# Patient Record
Sex: Female | Born: 1980 | Race: White | Hispanic: No | Marital: Married | Smoking: Never smoker
Health system: Southern US, Community
[De-identification: ages and names within clinical notes are randomized; demographics above are authoritative.]

## PROBLEM LIST (undated history)

## (undated) ENCOUNTER — Inpatient Hospital Stay (HOSPITAL_COMMUNITY): Payer: Self-pay

## (undated) DIAGNOSIS — N390 Urinary tract infection, site not specified: Secondary | ICD-10-CM

## (undated) DIAGNOSIS — K8020 Calculus of gallbladder without cholecystitis without obstruction: Secondary | ICD-10-CM

## (undated) DIAGNOSIS — R87629 Unspecified abnormal cytological findings in specimens from vagina: Secondary | ICD-10-CM

## (undated) DIAGNOSIS — N200 Calculus of kidney: Secondary | ICD-10-CM

## (undated) HISTORY — PX: TONSILLECTOMY: SUR1361

## (undated) HISTORY — DX: Calculus of kidney: N20.0

## (undated) HISTORY — DX: Calculus of gallbladder without cholecystitis without obstruction: K80.20

## (undated) HISTORY — DX: Unspecified abnormal cytological findings in specimens from vagina: R87.629

## (undated) HISTORY — DX: Urinary tract infection, site not specified: N39.0

## (undated) HISTORY — PX: CHOLECYSTECTOMY: SHX55

---
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
## 2001-07-07 HISTORY — PX: CERVICAL CONE BIOPSY: SUR198

## 2002-01-25 ENCOUNTER — Other Ambulatory Visit: Admission: RE | Admit: 2002-01-25 | Discharge: 2002-01-25 | Payer: Self-pay | Admitting: Obstetrics and Gynecology

## 2002-07-12 ENCOUNTER — Other Ambulatory Visit: Admission: RE | Admit: 2002-07-12 | Discharge: 2002-07-12 | Payer: Self-pay | Admitting: Obstetrics and Gynecology

## 2002-09-13 ENCOUNTER — Ambulatory Visit (HOSPITAL_COMMUNITY): Admission: RE | Admit: 2002-09-13 | Discharge: 2002-09-13 | Payer: Self-pay | Admitting: Obstetrics and Gynecology

## 2002-11-29 ENCOUNTER — Other Ambulatory Visit: Admission: RE | Admit: 2002-11-29 | Discharge: 2002-11-29 | Payer: Self-pay | Admitting: Obstetrics and Gynecology

## 2003-04-28 ENCOUNTER — Ambulatory Visit (HOSPITAL_COMMUNITY): Admission: RE | Admit: 2003-04-28 | Discharge: 2003-04-28 | Payer: Self-pay | Admitting: Obstetrics and Gynecology

## 2004-10-22 ENCOUNTER — Other Ambulatory Visit: Admission: RE | Admit: 2004-10-22 | Discharge: 2004-10-22 | Payer: Self-pay | Admitting: Obstetrics and Gynecology

## 2005-04-30 ENCOUNTER — Encounter: Admission: RE | Admit: 2005-04-30 | Discharge: 2005-04-30 | Payer: Self-pay | Admitting: Sports Medicine

## 2005-05-16 ENCOUNTER — Other Ambulatory Visit: Admission: RE | Admit: 2005-05-16 | Discharge: 2005-05-16 | Payer: Self-pay | Admitting: Obstetrics and Gynecology

## 2011-12-31 ENCOUNTER — Other Ambulatory Visit (HOSPITAL_COMMUNITY): Payer: Self-pay | Admitting: Obstetrics and Gynecology

## 2011-12-31 DIAGNOSIS — N979 Female infertility, unspecified: Secondary | ICD-10-CM

## 2012-01-05 ENCOUNTER — Ambulatory Visit (HOSPITAL_COMMUNITY)
Admission: RE | Admit: 2012-01-05 | Discharge: 2012-01-05 | Disposition: A | Discharge status: Home / Self Care | Payer: BC Managed Care – PPO | Source: Ambulatory Visit | Attending: Obstetrics and Gynecology | Admitting: Obstetrics and Gynecology

## 2012-01-05 DIAGNOSIS — N979 Female infertility, unspecified: Secondary | ICD-10-CM | POA: Insufficient documentation

## 2012-01-05 MED ORDER — IOHEXOL 300 MG/ML  SOLN: 6.0000 mL | Freq: Once | INTRAMUSCULAR | Status: AC | PRN

## 2012-04-06 ENCOUNTER — Other Ambulatory Visit: Payer: Self-pay

## 2012-04-27 LAB — OB RESULTS CONSOLE ABO/RH: RH Type: POSITIVE

## 2012-04-27 LAB — OB RESULTS CONSOLE GC/CHLAMYDIA: Chlamydia: NEGATIVE

## 2012-04-27 LAB — OB RESULTS CONSOLE HEPATITIS B SURFACE ANTIGEN: Hepatitis B Surface Ag: NEGATIVE

## 2012-04-27 LAB — OB RESULTS CONSOLE RPR: RPR: NONREACTIVE

## 2012-04-27 LAB — OB RESULTS CONSOLE ANTIBODY SCREEN: Antibody Screen: NEGATIVE

## 2012-06-15 ENCOUNTER — Inpatient Hospital Stay (HOSPITAL_COMMUNITY)
Admission: AD | Admit: 2012-06-15 | Discharge: 2012-06-15 | Disposition: A | Discharge status: Home / Self Care | Payer: BC Managed Care – PPO | Source: Ambulatory Visit | Attending: Obstetrics and Gynecology | Admitting: Obstetrics and Gynecology

## 2012-06-15 ENCOUNTER — Inpatient Hospital Stay (HOSPITAL_COMMUNITY): Payer: BC Managed Care – PPO

## 2012-06-15 ENCOUNTER — Encounter (HOSPITAL_COMMUNITY): Payer: Self-pay

## 2012-06-15 DIAGNOSIS — O219 Vomiting of pregnancy, unspecified: Secondary | ICD-10-CM

## 2012-06-15 DIAGNOSIS — O26899 Other specified pregnancy related conditions, unspecified trimester: Secondary | ICD-10-CM

## 2012-06-15 DIAGNOSIS — K5900 Constipation, unspecified: Secondary | ICD-10-CM

## 2012-06-15 DIAGNOSIS — M549 Dorsalgia, unspecified: Secondary | ICD-10-CM | POA: Insufficient documentation

## 2012-06-15 DIAGNOSIS — R109 Unspecified abdominal pain: Secondary | ICD-10-CM | POA: Insufficient documentation

## 2012-06-15 DIAGNOSIS — O210 Mild hyperemesis gravidarum: Secondary | ICD-10-CM | POA: Insufficient documentation

## 2012-06-15 LAB — URINALYSIS, ROUTINE W REFLEX MICROSCOPIC
Bilirubin Urine: NEGATIVE
Glucose, UA: NEGATIVE mg/dL
Ketones, ur: NEGATIVE mg/dL
Leukocytes, UA: NEGATIVE
Nitrite: NEGATIVE
Protein, ur: NEGATIVE mg/dL

## 2012-06-15 LAB — URINE MICROSCOPIC-ADD ON

## 2012-06-15 MED ORDER — OXYCODONE-ACETAMINOPHEN 5-325 MG PO TABS: 1.0000 | ORAL_TABLET | Freq: Four times a day (QID) | ORAL | Status: DC | PRN

## 2012-06-15 MED ORDER — DOCUSATE SODIUM 100 MG PO CAPS: 100.0000 mg | ORAL_CAPSULE | Freq: Two times a day (BID) | ORAL | Status: DC

## 2012-06-15 MED ORDER — OXYCODONE-ACETAMINOPHEN 5-325 MG PO TABS
1.0000 | ORAL_TABLET | Freq: Once | ORAL | Status: AC
  Administered 2012-06-15: 1 via ORAL
  Filled 2012-06-15: qty 1

## 2012-06-15 MED ORDER — LACTATED RINGERS IV BOLUS (SEPSIS)
1000.0000 mL | Freq: Once | INTRAVENOUS | Status: AC
  Administered 2012-06-15: 1000 mL via INTRAVENOUS

## 2012-06-15 MED ORDER — ONDANSETRON HCL 4 MG/2ML IJ SOLN
4.0000 mg | Freq: Once | INTRAMUSCULAR | Status: AC
  Administered 2012-06-15: 4 mg via INTRAVENOUS
  Filled 2012-06-15: qty 2

## 2012-06-15 NOTE — Progress Notes (Signed)
Dr. Claiborne Billings notified of patient return from [place] and reports pain 1/10. [place] negative for kidney stone. Dr. Claiborne Billings asked for E. Key NP to write for 10 percocet at discharge and to strain all urine.

## 2012-06-15 NOTE — MAU Note (Signed)
Pt is discharged home by Dr. Claiborne Billings and is upset that she is being sent home. Pt says she is worried that she will go home and the pain will become worse and the vomiting will start again. Pt is concerned that we do not have a known reason for the pain and vomiting. Pt is unconvinced that the vomiting is from a virus due to the duration of the problem.

## 2012-06-15 NOTE — MAU Note (Signed)
Pt presents to MAU with chief complaint of N/V and lower left abdominal pain. Pt is a G3P1 at [redacted]w[redacted]d with prior history of a 35 week delivery due to Oligohydramnios. Pt says the N/V started last Friday and has been off and on. Pt denies any complications with this pregnancy. Denies bleeding, or abnormal discharge.

## 2012-06-15 NOTE — H&P (Signed)
31 y.o. complains of nausea and vomiting, not being able to keep anything down since last night.  Pt also reports abdominal pain that she finds difficult to describe.  She states at times it has been all over.  Worse with vomiting.  It started Saturday and she called the on call dr. And was prescribed phenergan suppositories that put her to sleep so didn't experience pain and nausea resolved.  She said she ate well last night and pain resolved but then woke up at 4am with N/V and pain.  She says the pain is mainly on the left side and indicates her inguinal area but sometimes starts higher and radiates the that region.  She also reports some b/l low back pain.  She denies fever, any urinary symptoms. She has some constipation. She denies vaginal bleeding or pelvic cramping.  No other complaints.  History reviewed. No pertinent past medical history. Past Surgical History  Procedure Date  . Cholecystectomy     History   Social History  . Marital Status: Married    Spouse Name: N/A    Number of Children: N/A  . Years of Education: N/A   Occupational History  . Not on file.   Social History Main Topics  . Smoking status: Never Smoker   . Smokeless tobacco: Not on file  . Alcohol Use: No  . Drug Use: No  . Sexually Active: Yes   Other Topics Concern  . Not on file   Social History Narrative  . No narrative on file    No current facility-administered medications on file prior to encounter.   Current Outpatient Prescriptions on File Prior to Encounter  Medication Sig Dispense Refill  . promethazine (PHENERGAN) 25 MG suppository Place 25 mg rectally every 6 (six) hours as needed. nausea        No Known Allergies  @VITALS2 @  Lungs: clear to ascultation Cor:  RRR Abdomen:  soft, nontender, nondistended, no rebound or guarding Back: no CVA tenderness Ex:  no cords, erythema Pelvic:  Deferred  Labs: UA neg except for moderate blood [place]: no obvious nephrolithiasis  A/P:   Nephrolithiasis vs constipation/gas pain Despite no stone visualized on [place], I suspect small stone present or recently passed.  Pt received one percocet while in MAU and pain diminished to a 1 out of 10.  Discussed possibility of stone with patient and treatment for such.  Advised patient to increase fluid intake and strain urine.  Percocet prn. Call tomorrow to f/u in office.  Will not preform CT at this time as pain has resolved.  Pt understands to call if fever develops or worsening pain or other problems.   Philip Aspen

## 2012-06-15 NOTE — MAU Note (Signed)
Dr. Claiborne Billings returned RN's call and was asked to come and speak to the patient. Dr. Claiborne Billings on her way to MAU to see the patient.

## 2012-06-15 NOTE — MAU Provider Note (Signed)
History     CSN: [id number]  Arrival date and time: 06/15/12 7425   First Provider Initiated Contact with Patient 06/15/12 1032      Chief Complaint  Patient presents with  . Emesis During Pregnancy  . Abdominal Pain  . Back Pain   HPI Victoria Romero is 31 y.o. [phone] [redacted]w[redacted]d weeks presenting with persistent nausea and vomiting in pregnancy.  Began suddenly 4 days ago.  Thought it was a virus.  She has had same sxs during this pregnancy.  Has used Zofran as well.   Spoke to Dr. On call over the weekend and Phenergan Supp called in.  She used them Sat and Sun night.  Was able to go to work but nausea/vomiting returned early this am.  Vomited X 3 today.  DId not use phenergan today because she had to drive.  Called Nurse on call instructed to come to MAU.  Denies diarrhea, fever.   Hasn't eaten or had fluids since 7pm last night.  Was able to keep fluids in yesterday.  Has lower abdominal pain associated with vomiting, now constant.  Hx constipation--last normal BM 4 days ago and had a little stool today.    History reviewed. No pertinent past medical history.  Past Surgical History  Procedure Date  . Cholecystectomy     History reviewed. No pertinent family history.  History  Substance Use Topics  . Smoking status: Never Smoker   . Smokeless tobacco: Not on file  . Alcohol Use: No    Allergies: No Known Allergies  Prescriptions prior to admission  Medication Sig Dispense Refill  . ondansetron (ZOFRAN) 4 MG tablet Take 4 mg by mouth every 8 (eight) hours as needed. nausea      . Prenatal Vit-Fe Fumarate-FA (PRENATAL MULTIVITAMIN) TABS Take 1 tablet by mouth daily.      . promethazine (PHENERGAN) 25 MG suppository Place 25 mg rectally every 6 (six) hours as needed. nausea        Review of Systems  Constitutional: Negative for fever and chills.  Gastrointestinal: Positive for nausea, vomiting and abdominal pain (lower). Negative for diarrhea.  Musculoskeletal: Positive for  back pain (lower).   Physical Exam   Blood pressure 124/79, pulse 97, temperature 98 F (36.7 C), temperature source Oral, resp. rate 16, height 5\' 7"  (1.702 m), weight 160 lb 3.2 oz (72.666 kg), SpO2 100.00%.  Physical Exam  Constitutional: She is oriented to person, place, and time. She appears well-developed and well-nourished. No distress.  HENT:  Head: Normocephalic.  Neck: Normal range of motion.  Cardiovascular: Normal rate.   Respiratory: Effort normal.  GI: There is no tenderness.  Genitourinary:       Dilation: Closed Effacement (%): Thick Cervical Position: Middle Presentation: Undeterminable Exam by:: J. Rasch RN   Neurological: She is alert and oriented to person, place, and time.  Skin: Skin is warm and dry.  Psychiatric: She has a normal mood and affect. Her behavior is normal.      Results for orders placed during the hospital encounter of 06/15/12 (from the past 24 hour(s))  URINALYSIS, ROUTINE W REFLEX MICROSCOPIC     Status: Abnormal   Collection Time   06/15/12  9:20 AM      Component Value Range   Color, Urine YELLOW  YELLOW   APPearance CLEAR  CLEAR   Specific Gravity, Urine 1.020  1.005 - 1.030   pH 6.0  5.0 - 8.0   Glucose, UA NEGATIVE  NEGATIVE mg/dL  Hgb urine dipstick MODERATE (*) NEGATIVE   Bilirubin Urine NEGATIVE  NEGATIVE   Ketones, ur NEGATIVE  NEGATIVE mg/dL   Protein, ur NEGATIVE  NEGATIVE mg/dL   Urobilinogen, UA 1.0  0.0 - 1.0 mg/dL   Nitrite NEGATIVE  NEGATIVE   Leukocytes, UA NEGATIVE  NEGATIVE  URINE MICROSCOPIC-ADD ON     Status: Normal   Collection Time   06/15/12  9:20 AM      Component Value Range   Squamous Epithelial / LPF RARE  RARE   WBC, UA 0-2  <3 WBC/hpf   RBC / HPF 0-2  <3 RBC/hpf   Urine-Other YEAST      MAU Course  Procedures  Urine culture to lab  MDM 10:40  Call to Dr. Claiborne Billings to report MSE, she asked if she could call back.   12:20  Patient has stopped vomiting after 1 liter of LR with 4mg  of Zofran.   States her lower back and lower abdominal pain is worse.  Points to suprapubic area and lower back.  I called Dr. Claiborne Billings and reported response to IV hydration and Zofran as well as pain in her lower abdomen and lower back.  Order given to check cervix and if closed discharge to home with Colace.  F/U with Dr. Arlyce Dice  15:55 Victoria Bowens, Rn reports that Dr. Claiborne Billings was informed of the negative renal ultrasound results.  She states Dr. Claiborne Billings is not longer in the hospital and that Dr. Claiborne Billings asked that I write the patient a Rx for Percocet #10 tabs for discharge.   Assessment and Plan  A:  Nausea  and Vomiting at [redacted]w[redacted]d gestation     Abdominal and lower back pain     Hx constipation  P:  Rx for Colace      Stressed importance of staying well hydrated     Call Dr. Arlyce Dice  if sxs worsen.  KEY,EVE M 06/15/2012, 12:19 PM

## 2012-06-15 NOTE — MAU Note (Signed)
Patient states she has been vomiting since 12-7, has taken Phenergan and felt better until about 0300. Hasn't keep anything down. Has been having abdominal cramping since 12-7.

## 2012-06-16 LAB — URINE CULTURE

## 2012-07-07 NOTE — L&D Delivery Note (Signed)
Patient was C/C and pushed for approx. 40 minutes with epidural.   NSVD female infant, Apgars 8/9, weight pending.   The patient had no lacerations needing repair. Fundus was firm. EBL was expected. Placenta was delivered intact. Vagina was clear.  Baby was vigorous to bedside.  Philip Aspen

## 2012-10-28 ENCOUNTER — Encounter (HOSPITAL_COMMUNITY): Payer: Self-pay | Admitting: Anesthesiology

## 2012-10-28 ENCOUNTER — Inpatient Hospital Stay (HOSPITAL_COMMUNITY)
Admission: AD | Admit: 2012-10-28 | Discharge: 2012-10-30 | DRG: 372 | Disposition: A | Discharge status: Home / Self Care | Payer: BC Managed Care – PPO | Source: Ambulatory Visit | Attending: Obstetrics and Gynecology | Admitting: Obstetrics and Gynecology

## 2012-10-28 ENCOUNTER — Inpatient Hospital Stay (HOSPITAL_COMMUNITY): Payer: BC Managed Care – PPO | Admitting: Anesthesiology

## 2012-10-28 ENCOUNTER — Encounter (HOSPITAL_COMMUNITY): Payer: Self-pay | Admitting: General Practice

## 2012-10-28 DIAGNOSIS — O99892 Other specified diseases and conditions complicating childbirth: Secondary | ICD-10-CM | POA: Diagnosis present

## 2012-10-28 DIAGNOSIS — Z22330 Carrier of Group B streptococcus: Secondary | ICD-10-CM

## 2012-10-28 DIAGNOSIS — O4290 Premature rupture of membranes, unspecified as to length of time between rupture and onset of labor, unspecified weeks of gestation: Principal | ICD-10-CM | POA: Diagnosis present

## 2012-10-28 DIAGNOSIS — Z3480 Encounter for supervision of other normal pregnancy, unspecified trimester: Secondary | ICD-10-CM

## 2012-10-28 LAB — CBC
HCT: 33.9 % — ABNORMAL LOW (ref 36.0–46.0)
MCH: 28 pg (ref 26.0–34.0)
MCHC: 32.7 g/dL (ref 30.0–36.0)
MCV: 85.6 fL (ref 78.0–100.0)
Platelets: 195 10*3/uL (ref 150–400)
RDW: 14.6 % (ref 11.5–15.5)
WBC: 9.5 10*3/uL (ref 4.0–10.5)

## 2012-10-28 MED ORDER — DIPHENHYDRAMINE HCL 50 MG/ML IJ SOLN: 12.5000 mg | INTRAMUSCULAR | Status: DC | PRN

## 2012-10-28 MED ORDER — OXYTOCIN 40 UNITS IN LACTATED RINGERS INFUSION - SIMPLE MED
1.0000 m[IU]/min | INTRAVENOUS | Status: DC
  Administered 2012-10-28: 18 m[IU]/min via INTRAVENOUS
  Administered 2012-10-28: 2 m[IU]/min via INTRAVENOUS
  Filled 2012-10-28: qty 1000

## 2012-10-28 MED ORDER — EPHEDRINE 5 MG/ML INJ
10.0000 mg | INTRAVENOUS | Status: DC | PRN
  Filled 2012-10-28: qty 4
  Filled 2012-10-28: qty 2

## 2012-10-28 MED ORDER — LACTATED RINGERS IV SOLN
INTRAVENOUS | Status: DC
  Administered 2012-10-28: 23:00:00 via INTRAVENOUS
  Administered 2012-10-28: 125 mL/h via INTRAVENOUS

## 2012-10-28 MED ORDER — LIDOCAINE HCL (PF) 1 % IJ SOLN
INTRAMUSCULAR | Status: DC | PRN
  Administered 2012-10-28 (×5): 4 mL

## 2012-10-28 MED ORDER — FENTANYL 2.5 MCG/ML BUPIVACAINE 1/10 % EPIDURAL INFUSION (WH - ANES)
14.0000 mL/h | INTRAMUSCULAR | Status: DC | PRN
  Administered 2012-10-28 – 2012-10-29 (×2): 14 mL/h via EPIDURAL
  Filled 2012-10-28 (×2): qty 125

## 2012-10-28 MED ORDER — LACTATED RINGERS IV SOLN
500.0000 mL | Freq: Once | INTRAVENOUS | Status: AC
  Administered 2012-10-28: 500 mL via INTRAVENOUS

## 2012-10-28 MED ORDER — OXYTOCIN BOLUS FROM INFUSION
500.0000 mL | INTRAVENOUS | Status: DC
  Administered 2012-10-29: 500 mL via INTRAVENOUS

## 2012-10-28 MED ORDER — PENICILLIN G POTASSIUM 5000000 UNITS IJ SOLR
5.0000 10*6.[IU] | Freq: Once | INTRAVENOUS | Status: AC
  Administered 2012-10-28: 5 10*6.[IU] via INTRAVENOUS
  Filled 2012-10-28: qty 5

## 2012-10-28 MED ORDER — LIDOCAINE HCL (PF) 1 % IJ SOLN
30.0000 mL | INTRAMUSCULAR | Status: DC | PRN
  Filled 2012-10-28: qty 30

## 2012-10-28 MED ORDER — PENICILLIN G POTASSIUM 5000000 UNITS IJ SOLR
2.5000 10*6.[IU] | INTRAVENOUS | Status: DC
  Administered 2012-10-28 – 2012-10-29 (×3): 2.5 10*6.[IU] via INTRAVENOUS
  Filled 2012-10-28 (×7): qty 2.5

## 2012-10-28 MED ORDER — TERBUTALINE SULFATE 1 MG/ML IJ SOLN: 0.2500 mg | Freq: Once | INTRAMUSCULAR | Status: AC | PRN

## 2012-10-28 MED ORDER — EPHEDRINE 5 MG/ML INJ
10.0000 mg | INTRAVENOUS | Status: DC | PRN
  Filled 2012-10-28: qty 2

## 2012-10-28 MED ORDER — ACETAMINOPHEN 325 MG PO TABS: 650.0000 mg | ORAL_TABLET | ORAL | Status: DC | PRN

## 2012-10-28 MED ORDER — CITRIC ACID-SODIUM CITRATE 334-500 MG/5ML PO SOLN: 30.0000 mL | ORAL | Status: DC | PRN

## 2012-10-28 MED ORDER — LACTATED RINGERS IV SOLN: 500.0000 mL | INTRAVENOUS | Status: DC | PRN

## 2012-10-28 MED ORDER — OXYTOCIN 40 UNITS IN LACTATED RINGERS INFUSION - SIMPLE MED
62.5000 mL/h | INTRAVENOUS | Status: DC
  Administered 2012-10-29: 62.5 mL/h via INTRAVENOUS

## 2012-10-28 MED ORDER — OXYCODONE-ACETAMINOPHEN 5-325 MG PO TABS: 1.0000 | ORAL_TABLET | ORAL | Status: DC | PRN

## 2012-10-28 MED ORDER — IBUPROFEN 600 MG PO TABS: 600.0000 mg | ORAL_TABLET | Freq: Four times a day (QID) | ORAL | Status: DC | PRN

## 2012-10-28 MED ORDER — ONDANSETRON HCL 4 MG/2ML IJ SOLN: 4.0000 mg | Freq: Four times a day (QID) | INTRAMUSCULAR | Status: DC | PRN

## 2012-10-28 MED ORDER — PHENYLEPHRINE 40 MCG/ML (10ML) SYRINGE FOR IV PUSH (FOR BLOOD PRESSURE SUPPORT)
80.0000 ug | PREFILLED_SYRINGE | INTRAVENOUS | Status: DC | PRN
  Filled 2012-10-28: qty 2

## 2012-10-28 MED ORDER — PHENYLEPHRINE 40 MCG/ML (10ML) SYRINGE FOR IV PUSH (FOR BLOOD PRESSURE SUPPORT)
80.0000 ug | PREFILLED_SYRINGE | INTRAVENOUS | Status: DC | PRN
  Filled 2012-10-28: qty 5
  Filled 2012-10-28: qty 2

## 2012-10-28 MED ORDER — FLEET ENEMA 7-19 GM/118ML RE ENEM: 1.0000 | ENEMA | Freq: Once | RECTAL | Status: DC

## 2012-10-28 NOTE — H&P (Signed)
32 y.o. [redacted]w[redacted]d  [id number] comes in from office after being found PROM.  Pt states she started leaking about 7am.  She denies any contractions.  Otherwise has good fetal movement and no bleeding.  History reviewed. No pertinent past medical history.  Past Surgical History  Procedure Laterality Date  . Cholecystectomy    . Leep  2002  . Cervical cone biopsy  2003    OB History   Grav Para Term Preterm Abortions TAB SAB Ect Mult Living   3 1 0 1 1 0 1 0 0 1      # Outc Date GA Lbr Len/2nd Wgt Sex Del Anes PTL Lv   1 PRE            2 SAB            3 CUR               History   Social History  . Marital Status: Married    Spouse Name: N/A    Number of Children: N/A  . Years of Education: N/A   Occupational History  . Not on file.   Social History Main Topics  . Smoking status: Never Smoker   . Smokeless tobacco: Not on file  . Alcohol Use: No  . Drug Use: No  . Sexually Active: Yes   Other Topics Concern  . Not on file   Social History Narrative  . No narrative on file   Review of patient's allergies indicates no known allergies.    Prenatal Transfer Tool  Maternal Diabetes: No Genetic Screening: Normal Maternal Ultrasounds/Referrals: Normal Fetal Ultrasounds or other Referrals:  None Maternal Substance Abuse:  No Significant Maternal Medications:  None Significant Maternal Lab Results: Lab values include: Group B Strep positive  Other PNC:  H/o Leep and CKC, may have some fibrotic scarring at os    Filed Vitals:   10/28/12 1606  BP: 95/57  Pulse: 85  Temp:   Resp: 20     Lungs/Cor:  NAD Abdomen:  soft, gravid Ex:  no cords, erythema SVE:  0/70/-2 FHTs:  140, good STV, NST R Toco:  None at admission, currently not tracing well, but prior to rolling over was q3, pit @10    A/P   Admit to L&D with PROM  GBS Pos - PCN  Pit 2x2  Epidural when desired  Other routine care.  Philip Aspen

## 2012-10-28 NOTE — Lactation Note (Signed)
Lactation Consultation Note  Patient Name: Victoria Romero Date: 10/28/2012   [facility] contacted this patient by phone at her request prior to delivery  Maternal Data  Mom states she wants to pump and bottle-feed and hopes to purchase an electric pump prior to discharge.  At this time, she is in early labor.  Feeding    LATCH Score/Interventions           N/A - baby not yet delivered           Lactation Tools Discussed/Used   Pumping options and recommendations; informed patient that after delivery, LC or RN will assist her to start pumping within baby's first 6 hours and encourage her, if possible to pump at least every 3 hours with hospital pump.  LC will follow-up after delivery and discuss purchase and/or rental choices.  Mom to call her insurance company to determine if they would cover purchase or instead, would send a pump to her, as this would determine what she would need after discharge.  Consult Status   LC to follow-up after delivery   Lynda Rainwater 10/28/2012, 7:23 PM

## 2012-10-28 NOTE — Progress Notes (Signed)
Pt comfortable, s/p epidural FHT: overall reassuring 150 mod var + accels, some recent variables and several late in appearance TOCO 2-4 SVE: 3/90/-1 Pit @18  Continue current mgmt with position changes, O2, fluid prn

## 2012-10-28 NOTE — Progress Notes (Signed)
Pt refusing Pitocin at this time. Pt waiting for husband to arrive at hospital.  Will probably consent to Pitocin at about 1300

## 2012-10-28 NOTE — Anesthesia Preprocedure Evaluation (Signed)

## 2012-10-28 NOTE — Anesthesia Procedure Notes (Signed)
Epidural Patient location during procedure: OB Start time: 10/28/2012 6:35 PM  Staffing Performed by: anesthesiologist   Preanesthetic Checklist Completed: patient identified, site marked, surgical consent, pre-op evaluation, timeout performed, IV checked, risks and benefits discussed and monitors and equipment checked  Epidural Patient position: sitting Prep: site prepped and draped and DuraPrep Patient monitoring: continuous pulse ox and blood pressure Approach: midline Injection technique: LOR air  Needle:  Needle type: Tuohy  Needle gauge: 17 G Needle length: 9 cm and 9 Needle insertion depth: 5 cm cm Catheter type: closed end flexible Catheter size: 19 Gauge Catheter at skin depth: 10 cm Test dose: negative  Assessment Events: blood not aspirated, injection not painful, no injection resistance, negative IV test and no paresthesia  Additional Notes Discussed risk of headache, infection, bleeding, nerve injury and failed or incomplete block.  Patient voices understanding and wishes to proceed.  Epidural placed easily on first attempt.  No paresthesia.  Patient tolerated procedure well with no apparent complications.  Jasmine December, MDReason for block:procedure for pain

## 2012-10-29 ENCOUNTER — Encounter (HOSPITAL_COMMUNITY): Payer: Self-pay | Admitting: *Deleted

## 2012-10-29 LAB — CBC
MCV: 85.9 fL (ref 78.0–100.0)
Platelets: 159 10*3/uL (ref 150–400)
RBC: 3.61 MIL/uL — ABNORMAL LOW (ref 3.87–5.11)
RDW: 14.7 % (ref 11.5–15.5)
WBC: 13.9 10*3/uL — ABNORMAL HIGH (ref 4.0–10.5)

## 2012-10-29 LAB — ABO/RH: ABO/RH(D): O POS

## 2012-10-29 MED ORDER — DIBUCAINE 1 % RE OINT: 1.0000 "application " | TOPICAL_OINTMENT | RECTAL | Status: DC | PRN

## 2012-10-29 MED ORDER — OXYCODONE-ACETAMINOPHEN 5-325 MG PO TABS: 1.0000 | ORAL_TABLET | ORAL | Status: DC | PRN

## 2012-10-29 MED ORDER — BENZOCAINE-MENTHOL 20-0.5 % EX AERO
1.0000 "application " | INHALATION_SPRAY | CUTANEOUS | Status: DC | PRN
  Administered 2012-10-29: 1 via TOPICAL
  Filled 2012-10-29: qty 56

## 2012-10-29 MED ORDER — ZOLPIDEM TARTRATE 5 MG PO TABS: 5.0000 mg | ORAL_TABLET | Freq: Every evening | ORAL | Status: DC | PRN

## 2012-10-29 MED ORDER — IBUPROFEN 600 MG PO TABS
600.0000 mg | ORAL_TABLET | Freq: Four times a day (QID) | ORAL | Status: DC
  Administered 2012-10-29 – 2012-10-30 (×6): 600 mg via ORAL
  Filled 2012-10-29 (×5): qty 1

## 2012-10-29 MED ORDER — WITCH HAZEL-GLYCERIN EX PADS: 1.0000 "application " | MEDICATED_PAD | CUTANEOUS | Status: DC | PRN

## 2012-10-29 MED ORDER — ONDANSETRON HCL 4 MG/2ML IJ SOLN: 4.0000 mg | INTRAMUSCULAR | Status: DC | PRN

## 2012-10-29 MED ORDER — DIPHENHYDRAMINE HCL 25 MG PO CAPS: 25.0000 mg | ORAL_CAPSULE | Freq: Four times a day (QID) | ORAL | Status: DC | PRN

## 2012-10-29 MED ORDER — SENNOSIDES-DOCUSATE SODIUM 8.6-50 MG PO TABS
2.0000 | ORAL_TABLET | Freq: Every day | ORAL | Status: DC
  Administered 2012-10-29: 2 via ORAL

## 2012-10-29 MED ORDER — TETANUS-DIPHTH-ACELL PERTUSSIS 5-2.5-18.5 LF-MCG/0.5 IM SUSP
0.5000 mL | Freq: Once | INTRAMUSCULAR | Status: AC
  Administered 2012-10-29: 0.5 mL via INTRAMUSCULAR

## 2012-10-29 MED ORDER — ONDANSETRON HCL 4 MG PO TABS: 4.0000 mg | ORAL_TABLET | ORAL | Status: DC | PRN

## 2012-10-29 MED ORDER — LANOLIN HYDROUS EX OINT: TOPICAL_OINTMENT | CUTANEOUS | Status: DC | PRN

## 2012-10-29 MED ORDER — SIMETHICONE 80 MG PO CHEW: 80.0000 mg | CHEWABLE_TABLET | ORAL | Status: DC | PRN

## 2012-10-29 MED ORDER — PRENATAL MULTIVITAMIN CH
1.0000 | ORAL_TABLET | Freq: Every day | ORAL | Status: DC
  Administered 2012-10-29 – 2012-10-30 (×2): 1 via ORAL
  Filled 2012-10-29 (×2): qty 1

## 2012-10-29 NOTE — Progress Notes (Signed)
Pt transferred to bathroom via stedy. Peri care performed and taught to patient who verbalized understanding. Pt to wheelchair via stedy.

## 2012-10-29 NOTE — Progress Notes (Signed)
RN called into pt room per pt.and husband. Pt had just pumped breast and got 2 ml and fed to infant. Husband stated that infant sucked colostrum down fast . Then infant spit up all of the colostrum. RN observed infant sleeping . RN unwrapped infant and found rt. Leg mod led and rt. Foot ice cold and purple ble and leg leg slightly mod led and foot bluish but cool. Infant stimulated per RN  Per pt permission.; and infant sluggish on awakening . Infant per skin to skin with PT. For 5 - 10 minutes. Infant observed again per RN and found rt. Foot warm to touch and less,less mod led and left foot warm and color normal. Infant gave slight cry while stimulated. Infant replaced to pt for more skin to skin. PT expressing frustration with every care giver stating something different since infant born. Pt teary eyed- stating she has had no sleep and would rather give formula if necessary. Explained to pt that this RN  Would talk with lactation an nursery  And pediatrician so all caregivers would be on same plan for evening and night care. RN concern that only 3 ml of colostrum has been given infant during 1st 24 hrs of life. Plan to observe infant eating and to encourage more frequent feedings every 3 hours and to let pt and husband rest in between feeding attempts . Pt. And husband agreeable to plan of care. RN to coordinate care with tech and feedings .

## 2012-10-29 NOTE — Progress Notes (Signed)
PPD#1 Pt without complaints. Lochia-mild IMP/ doing well PLAN/ routine care

## 2012-10-29 NOTE — Progress Notes (Signed)
Mother has C/O not resting at all today due to nurses, doctors and techs entering the room.  She has had constant visitors also in the room.

## 2012-10-29 NOTE — Anesthesia Postprocedure Evaluation (Signed)
  Anesthesia Post-op Note  Patient: Victoria Romero  Procedure(s) Performed: * No procedures listed *  Patient Location: PACU and Mother/Baby  Anesthesia Type:Epidural  Level of Consciousness: awake, alert  and oriented  Airway and Oxygen Therapy: Patient Spontanous Breathing  Post-op Pain: none  Post-op Assessment: Post-op Vital signs reviewed, Patient's Cardiovascular Status Stable, No headache, No backache, No residual numbness and No residual motor weakness  Post-op Vital Signs: Reviewed and stable  Complications: No apparent anesthesia complications

## 2012-10-30 NOTE — Progress Notes (Signed)
PPD#1  Pt without complaints. Would like to go home. VSSAF IMP/ doing well PLAN/ will discharge.

## 2012-10-30 NOTE — Discharge Summary (Signed)
Obstetric Discharge Summary Reason for Admission: onset of labor and rupture of membranes Prenatal Procedures: ultrasound Intrapartum Procedures: spontaneous vaginal delivery Postpartum Procedures: none Complications-Operative and Postpartum: none Hemoglobin  Date Value Range Status  10/29/2012 10.1* 12.0 - 15.0 g/dL Final     HCT  Date Value Range Status  10/29/2012 31.0* 36.0 - 46.0 % Final    Physical Exam:  General: alert Lochia: appropriate Uterine Fundus: firm   Discharge Diagnoses: Term Pregnancy-delivered  Discharge Information: Date: 10/30/2012 Activity: pelvic rest Diet: routine Medications: PNV and Ibuprofen Condition: stable Instructions: refer to practice specific booklet Discharge to: home Follow-up Information   Follow up with CALLAHAN, SIDNEY, DO. Schedule an appointment as soon as possible for a visit in 4 weeks.   Contact information:   [address] [place] [address] [phone]       Newborn Data: Live born female  Birth Weight: 6 lb 4.6 oz (2852 g) APGAR: 8, 9  Home with mother.  Raywood Wailes E 10/30/2012, 11:24 AM

## 2012-11-01 ENCOUNTER — Encounter (HOSPITAL_COMMUNITY)
Admission: RE | Admit: 2012-11-01 | Discharge: 2012-11-01 | Disposition: A | Discharge status: Home / Self Care | Payer: BC Managed Care – PPO | Source: Ambulatory Visit | Attending: Obstetrics and Gynecology | Admitting: Obstetrics and Gynecology

## 2012-11-01 DIAGNOSIS — O923 Agalactia: Secondary | ICD-10-CM | POA: Insufficient documentation

## 2012-12-02 ENCOUNTER — Encounter (HOSPITAL_COMMUNITY)
Admission: RE | Admit: 2012-12-02 | Discharge: 2012-12-02 | Disposition: A | Discharge status: Home / Self Care | Payer: BC Managed Care – PPO | Source: Ambulatory Visit | Attending: Obstetrics and Gynecology | Admitting: Obstetrics and Gynecology

## 2012-12-02 DIAGNOSIS — O923 Agalactia: Secondary | ICD-10-CM | POA: Insufficient documentation

## 2014-05-08 ENCOUNTER — Encounter (HOSPITAL_COMMUNITY): Payer: Self-pay | Admitting: *Deleted

## 2015-03-29 ENCOUNTER — Ambulatory Visit: Admit: 2015-03-29 | Payer: Self-pay | Admitting: Obstetrics and Gynecology

## 2015-03-29 SURGERY — LAPAROSCOPY OPERATIVE
Anesthesia: Choice

## 2016-02-29 ENCOUNTER — Other Ambulatory Visit (HOSPITAL_COMMUNITY): Payer: Self-pay | Admitting: Obstetrics and Gynecology

## 2016-03-06 ENCOUNTER — Other Ambulatory Visit (HOSPITAL_COMMUNITY): Payer: Self-pay | Admitting: Obstetrics and Gynecology

## 2016-03-06 DIAGNOSIS — R1032 Left lower quadrant pain: Secondary | ICD-10-CM

## 2017-07-07 NOTE — L&D Delivery Note (Signed)
Patient was C/C/+2 and pushed for <10 minutes with epidural.    NSVD  female infant, Apgars pending, weight pending.   The patient had no laceration. Fundus was firm. EBL was expected amount. Placenta was delivered intact. Vagina was clear.  Delayed cord clamping done for 30-60 seconds while warming baby. Baby was vigorous and doing skin to skin with mother.  Philip AspenALLAHAN, Perlita Forbush

## 2017-07-08 LAB — OB RESULTS CONSOLE ABO/RH: RH TYPE: POSITIVE

## 2017-07-08 LAB — OB RESULTS CONSOLE RUBELLA ANTIBODY, IGM: Rubella: IMMUNE

## 2017-07-08 LAB — OB RESULTS CONSOLE GC/CHLAMYDIA
Chlamydia: NEGATIVE
GC PROBE AMP, GENITAL: NEGATIVE

## 2017-07-08 LAB — OB RESULTS CONSOLE HEPATITIS B SURFACE ANTIGEN: Hepatitis B Surface Ag: NEGATIVE

## 2017-07-08 LAB — OB RESULTS CONSOLE HIV ANTIBODY (ROUTINE TESTING): HIV: NONREACTIVE

## 2017-07-08 LAB — OB RESULTS CONSOLE ANTIBODY SCREEN: ANTIBODY SCREEN: NEGATIVE

## 2017-07-08 LAB — OB RESULTS CONSOLE RPR: RPR: NONREACTIVE

## 2017-08-07 ENCOUNTER — Ambulatory Visit
Admission: RE | Admit: 2017-08-07 | Discharge: 2017-08-07 | Disposition: A | Discharge status: Home / Self Care | Payer: 59 | Source: Ambulatory Visit | Attending: Obstetrics and Gynecology | Admitting: Obstetrics and Gynecology

## 2017-08-07 ENCOUNTER — Other Ambulatory Visit: Payer: Self-pay | Admitting: Obstetrics and Gynecology

## 2017-08-07 DIAGNOSIS — R109 Unspecified abdominal pain: Secondary | ICD-10-CM

## 2017-08-11 ENCOUNTER — Other Ambulatory Visit: Payer: Self-pay

## 2017-08-11 ENCOUNTER — Other Ambulatory Visit: Payer: Self-pay | Admitting: Obstetrics and Gynecology

## 2017-08-11 DIAGNOSIS — R109 Unspecified abdominal pain: Secondary | ICD-10-CM

## 2017-12-16 LAB — OB RESULTS CONSOLE GBS: GBS: NEGATIVE

## 2017-12-31 ENCOUNTER — Other Ambulatory Visit: Payer: Self-pay | Admitting: Obstetrics and Gynecology

## 2018-01-01 ENCOUNTER — Telehealth (HOSPITAL_COMMUNITY): Payer: Self-pay | Admitting: *Deleted

## 2018-01-01 ENCOUNTER — Encounter (HOSPITAL_COMMUNITY): Payer: Self-pay | Admitting: *Deleted

## 2018-01-01 NOTE — Telephone Encounter (Signed)
Preadmission screen  

## 2018-01-07 ENCOUNTER — Inpatient Hospital Stay (HOSPITAL_COMMUNITY): Payer: 59 | Admitting: Anesthesiology

## 2018-01-07 ENCOUNTER — Encounter (HOSPITAL_COMMUNITY): Payer: Self-pay | Admitting: *Deleted

## 2018-01-07 ENCOUNTER — Other Ambulatory Visit: Payer: Self-pay

## 2018-01-07 ENCOUNTER — Inpatient Hospital Stay (HOSPITAL_COMMUNITY)
Admission: AD | Admit: 2018-01-07 | Discharge: 2018-01-08 | DRG: 807 | Disposition: A | Discharge status: Home / Self Care | Payer: 59 | Attending: Obstetrics and Gynecology | Admitting: Obstetrics and Gynecology

## 2018-01-07 DIAGNOSIS — O4292 Full-term premature rupture of membranes, unspecified as to length of time between rupture and onset of labor: Secondary | ICD-10-CM | POA: Diagnosis present

## 2018-01-07 DIAGNOSIS — Z3A38 38 weeks gestation of pregnancy: Secondary | ICD-10-CM

## 2018-01-07 LAB — CBC
HEMATOCRIT: 33.8 % — AB (ref 36.0–46.0)
Hemoglobin: 11.2 g/dL — ABNORMAL LOW (ref 12.0–15.0)
MCH: 28.1 pg (ref 26.0–34.0)
MCHC: 33.1 g/dL (ref 30.0–36.0)
MCV: 84.9 fL (ref 78.0–100.0)
Platelets: 220 10*3/uL (ref 150–400)
RBC: 3.98 MIL/uL (ref 3.87–5.11)
RDW: 13.2 % (ref 11.5–15.5)
WBC: 10.1 10*3/uL (ref 4.0–10.5)

## 2018-01-07 LAB — TYPE AND SCREEN
ABO/RH(D): O POS
ANTIBODY SCREEN: NEGATIVE

## 2018-01-07 LAB — POCT FERN TEST: POCT FERN TEST: POSITIVE

## 2018-01-07 MED ORDER — DIPHENHYDRAMINE HCL 50 MG/ML IJ SOLN: 12.5000 mg | INTRAMUSCULAR | Status: DC | PRN

## 2018-01-07 MED ORDER — PHENYLEPHRINE 40 MCG/ML (10ML) SYRINGE FOR IV PUSH (FOR BLOOD PRESSURE SUPPORT)
80.0000 ug | PREFILLED_SYRINGE | INTRAVENOUS | Status: DC | PRN
  Filled 2018-01-07: qty 10
  Filled 2018-01-07: qty 5

## 2018-01-07 MED ORDER — ZOLPIDEM TARTRATE 5 MG PO TABS: 5.0000 mg | ORAL_TABLET | Freq: Every evening | ORAL | Status: DC | PRN

## 2018-01-07 MED ORDER — DIBUCAINE 1 % RE OINT: 1.0000 "application " | TOPICAL_OINTMENT | RECTAL | Status: DC | PRN

## 2018-01-07 MED ORDER — DIPHENHYDRAMINE HCL 25 MG PO CAPS: 25.0000 mg | ORAL_CAPSULE | Freq: Four times a day (QID) | ORAL | Status: DC | PRN

## 2018-01-07 MED ORDER — LACTATED RINGERS IV SOLN
INTRAVENOUS | Status: DC
  Administered 2018-01-07 (×2): via INTRAVENOUS

## 2018-01-07 MED ORDER — FENTANYL 2.5 MCG/ML BUPIVACAINE 1/10 % EPIDURAL INFUSION (WH - ANES): 14.0000 mL/h | INTRAMUSCULAR | Status: DC | PRN

## 2018-01-07 MED ORDER — OXYTOCIN BOLUS FROM INFUSION
500.0000 mL | Freq: Once | INTRAVENOUS | Status: AC
  Administered 2018-01-07: 500 mL via INTRAVENOUS

## 2018-01-07 MED ORDER — LIDOCAINE HCL (PF) 1 % IJ SOLN
INTRAMUSCULAR | Status: DC | PRN
  Administered 2018-01-07: 8 mL via EPIDURAL

## 2018-01-07 MED ORDER — ONDANSETRON HCL 4 MG/2ML IJ SOLN: 4.0000 mg | Freq: Four times a day (QID) | INTRAMUSCULAR | Status: DC | PRN

## 2018-01-07 MED ORDER — PHENYLEPHRINE 40 MCG/ML (10ML) SYRINGE FOR IV PUSH (FOR BLOOD PRESSURE SUPPORT)
80.0000 ug | PREFILLED_SYRINGE | INTRAVENOUS | Status: DC | PRN
  Filled 2018-01-07: qty 5
  Filled 2018-01-07: qty 10

## 2018-01-07 MED ORDER — TETANUS-DIPHTH-ACELL PERTUSSIS 5-2.5-18.5 LF-MCG/0.5 IM SUSP
0.5000 mL | Freq: Once | INTRAMUSCULAR | Status: AC
  Administered 2018-01-08: 0.5 mL via INTRAMUSCULAR

## 2018-01-07 MED ORDER — SIMETHICONE 80 MG PO CHEW: 80.0000 mg | CHEWABLE_TABLET | ORAL | Status: DC | PRN

## 2018-01-07 MED ORDER — COCONUT OIL OIL: 1.0000 "application " | TOPICAL_OIL | Status: DC | PRN

## 2018-01-07 MED ORDER — TERBUTALINE SULFATE 1 MG/ML IJ SOLN
0.2500 mg | Freq: Once | INTRAMUSCULAR | Status: DC | PRN
  Filled 2018-01-07: qty 1

## 2018-01-07 MED ORDER — WITCH HAZEL-GLYCERIN EX PADS: 1.0000 "application " | MEDICATED_PAD | CUTANEOUS | Status: DC | PRN

## 2018-01-07 MED ORDER — ONDANSETRON HCL 4 MG/2ML IJ SOLN: 4.0000 mg | INTRAMUSCULAR | Status: DC | PRN

## 2018-01-07 MED ORDER — ONDANSETRON HCL 4 MG PO TABS: 4.0000 mg | ORAL_TABLET | ORAL | Status: DC | PRN

## 2018-01-07 MED ORDER — ACETAMINOPHEN 325 MG PO TABS: 650.0000 mg | ORAL_TABLET | ORAL | Status: DC | PRN

## 2018-01-07 MED ORDER — SOD CITRATE-CITRIC ACID 500-334 MG/5ML PO SOLN: 30.0000 mL | ORAL | Status: DC | PRN

## 2018-01-07 MED ORDER — OXYCODONE-ACETAMINOPHEN 5-325 MG PO TABS: 2.0000 | ORAL_TABLET | ORAL | Status: DC | PRN

## 2018-01-07 MED ORDER — LACTATED RINGERS IV SOLN: 500.0000 mL | Freq: Once | INTRAVENOUS | Status: DC

## 2018-01-07 MED ORDER — PRENATAL MULTIVITAMIN CH
1.0000 | ORAL_TABLET | Freq: Every day | ORAL | Status: DC
  Administered 2018-01-08: 1 via ORAL
  Filled 2018-01-07: qty 1

## 2018-01-07 MED ORDER — IBUPROFEN 600 MG PO TABS
600.0000 mg | ORAL_TABLET | Freq: Four times a day (QID) | ORAL | Status: DC
  Administered 2018-01-07 – 2018-01-08 (×4): 600 mg via ORAL
  Filled 2018-01-07 (×4): qty 1

## 2018-01-07 MED ORDER — OXYTOCIN 40 UNITS IN LACTATED RINGERS INFUSION - SIMPLE MED: 2.5000 [IU]/h | INTRAVENOUS | Status: DC

## 2018-01-07 MED ORDER — EPHEDRINE 5 MG/ML INJ
10.0000 mg | INTRAVENOUS | Status: DC | PRN
  Filled 2018-01-07: qty 2

## 2018-01-07 MED ORDER — EPHEDRINE 5 MG/ML INJ: 10.0000 mg | INTRAVENOUS | Status: DC | PRN

## 2018-01-07 MED ORDER — LIDOCAINE HCL (PF) 1 % IJ SOLN
30.0000 mL | INTRAMUSCULAR | Status: DC | PRN
  Filled 2018-01-07: qty 30

## 2018-01-07 MED ORDER — OXYTOCIN 40 UNITS IN LACTATED RINGERS INFUSION - SIMPLE MED
1.0000 m[IU]/min | INTRAVENOUS | Status: DC
  Administered 2018-01-07: 2 m[IU]/min via INTRAVENOUS
  Filled 2018-01-07: qty 1000

## 2018-01-07 MED ORDER — BENZOCAINE-MENTHOL 20-0.5 % EX AERO: 1.0000 "application " | INHALATION_SPRAY | CUTANEOUS | Status: DC | PRN

## 2018-01-07 MED ORDER — LACTATED RINGERS IV SOLN: 500.0000 mL | INTRAVENOUS | Status: DC | PRN

## 2018-01-07 MED ORDER — PHENYLEPHRINE 40 MCG/ML (10ML) SYRINGE FOR IV PUSH (FOR BLOOD PRESSURE SUPPORT): 80.0000 ug | PREFILLED_SYRINGE | INTRAVENOUS | Status: DC | PRN

## 2018-01-07 MED ORDER — SENNOSIDES-DOCUSATE SODIUM 8.6-50 MG PO TABS
2.0000 | ORAL_TABLET | ORAL | Status: DC
  Administered 2018-01-07: 2 via ORAL
  Filled 2018-01-07: qty 2

## 2018-01-07 MED ORDER — FENTANYL 2.5 MCG/ML BUPIVACAINE 1/10 % EPIDURAL INFUSION (WH - ANES)
14.0000 mL/h | INTRAMUSCULAR | Status: DC | PRN
  Administered 2018-01-07: 14 mL/h via EPIDURAL
  Filled 2018-01-07: qty 100

## 2018-01-07 MED ORDER — OXYCODONE-ACETAMINOPHEN 5-325 MG PO TABS
1.0000 | ORAL_TABLET | ORAL | Status: DC | PRN
  Administered 2018-01-08: 1 via ORAL
  Filled 2018-01-07: qty 1

## 2018-01-07 MED ORDER — OXYCODONE-ACETAMINOPHEN 5-325 MG PO TABS: 1.0000 | ORAL_TABLET | ORAL | Status: DC | PRN

## 2018-01-07 MED ORDER — FLEET ENEMA 7-19 GM/118ML RE ENEM: 1.0000 | ENEMA | RECTAL | Status: DC | PRN

## 2018-01-07 NOTE — Anesthesia Procedure Notes (Signed)
Epidural Patient location during procedure: OB Start time: 01/07/2018 7:00 PM End time: 01/07/2018 7:05 PM  Staffing Anesthesiologist: Bethena Midgetddono, Adriell Polansky, MD  Preanesthetic Checklist Completed: patient identified, site marked, surgical consent, pre-op evaluation, timeout performed, IV checked, risks and benefits discussed and monitors and equipment checked  Epidural Patient position: sitting Prep: site prepped and draped and DuraPrep Patient monitoring: continuous pulse ox and blood pressure Approach: midline Location: L4-L5 Injection technique: LOR air  Needle:  Needle type: Tuohy  Needle gauge: 17 G Needle length: 9 cm and 9 Needle insertion depth: 8 cm Catheter type: closed end flexible Catheter size: 19 Gauge Catheter at skin depth: 13 cm Test dose: negative  Assessment Events: blood not aspirated, injection not painful, no injection resistance, negative IV test and no paresthesia

## 2018-01-07 NOTE — Anesthesia Preprocedure Evaluation (Signed)

## 2018-01-07 NOTE — MAU Note (Signed)
Leaking fluid since 12 noon, some contractions

## 2018-01-07 NOTE — H&P (Signed)
37 y.o. 4863w1d  [id number] comes in c/o LOF.  Otherwise has good fetal movement and no bleeding.  Past Medical History:  Diagnosis Date  . Vaginal Pap smear, abnormal     Past Surgical History:  Procedure Laterality Date  . CERVICAL CONE BIOPSY  2003  . CHOLECYSTECTOMY    . LEEP  2002  . TONSILLECTOMY      OB History  Gravida Para Term Preterm AB Living  4 2 1 1 1 2   SAB TAB Ectopic Multiple Live Births  1 0 0 0 2    # Outcome Date GA Lbr Len/2nd Weight Sex Delivery Anes PTL Lv  4 Current           3 Term 10/29/12 2571w4d 17:15 / 00:41 2.852 kg (6 lb 4.6 oz) F Vag-Spont EPI  LIV  2 Preterm 2001 2342w0d  2.693 kg (5 lb 15 oz) M  EPI  LIV  1 SAB             Social History   Socioeconomic History  . Marital status: Married    Spouse name: Not on file  . Number of children: Not on file  . Years of education: Not on file  . Highest education level: Not on file  Occupational History  . Not on file  Social Needs  . Financial resource strain: Not on file  . Food insecurity:    Worry: Not on file    Inability: Not on file  . Transportation needs:    Medical: Not on file    Non-medical: Not on file  Tobacco Use  . Smoking status: Never Smoker  . Smokeless tobacco: Never Used  Substance and Sexual Activity  . Alcohol use: No  . Drug use: No  . Sexual activity: Yes  Lifestyle  . Physical activity:    Days per week: Not on file    Minutes per session: Not on file  . Stress: Not on file  Relationships  . Social connections:    Talks on phone: Not on file    Gets together: Not on file    Attends religious service: Not on file    Active member of club or organization: Not on file    Attends meetings of clubs or organizations: Not on file    Relationship status: Not on file  . Intimate partner violence:    Fear of current or ex partner: Not on file    Emotionally abused: Not on file    Physically abused: Not on file    Forced sexual activity: Not on file  Other Topics  Concern  . Not on file  Social History Narrative  . Not on file   Patient has no known allergies.    Prenatal Transfer Tool  Maternal Diabetes: No Genetic Screening: Normal Maternal Ultrasounds/Referrals: Normal Fetal Ultrasounds or other Referrals:  None Maternal Substance Abuse:  No Significant Maternal Medications:  None Significant Maternal Lab Results: Lab values include: Group B Strep negative  Other PNC: AMA with antenatal testing    Vitals:   01/07/18 1406 01/07/18 1529  BP: 119/77 118/77  Pulse: 89 71  Resp: 16 16  Temp: 98.5 F (36.9 C) 98.2 F (36.8 C)  TempSrc: Oral Oral  SpO2: 99%   Weight: 88 kg (194 lb)   Height: 5\' 8"  (1.727 m)     Lungs/Cor:  NAD Abdomen:  soft, gravid Ex:  no cords, erythema SVE:  2/80/-2 at admission FHTs: 140 , good STV, NST  R Toco:  occ   A/P   Admit with PROM  GBS Neg  Pitocin 2x2 for augmentation  Other routine care  [place], Luther Parody

## 2018-01-08 LAB — CBC
HCT: 31.1 % — ABNORMAL LOW (ref 36.0–46.0)
HEMOGLOBIN: 10.3 g/dL — AB (ref 12.0–15.0)
MCH: 28.2 pg (ref 26.0–34.0)
MCHC: 33.1 g/dL (ref 30.0–36.0)
MCV: 85.2 fL (ref 78.0–100.0)
PLATELETS: 194 10*3/uL (ref 150–400)
RBC: 3.65 MIL/uL — ABNORMAL LOW (ref 3.87–5.11)
RDW: 13.3 % (ref 11.5–15.5)
WBC: 12.1 10*3/uL — ABNORMAL HIGH (ref 4.0–10.5)

## 2018-01-08 NOTE — Progress Notes (Signed)
Discharge paperwork reviewed with patient. Notified of warning signs and when to call doctor.

## 2018-01-08 NOTE — Anesthesia Postprocedure Evaluation (Signed)
Anesthesia Post Note  Patient: Victoria Romero S Carnell  Procedure(s) Performed: AN AD HOC LABOR EPIDURAL     Patient location during evaluation: Mother Baby Anesthesia Type: Epidural Level of consciousness: awake and alert Pain management: pain level controlled Vital Signs Assessment: post-procedure vital signs reviewed and stable Respiratory status: spontaneous breathing, nonlabored ventilation and respiratory function stable Cardiovascular status: stable Postop Assessment: no headache, no backache and epidural receding Anesthetic complications: no    Last Vitals:  Vitals:   01/08/18 0253 01/08/18 0640  BP: 111/67 110/60  Pulse: 69 67  Resp:    Temp: 36.8 C 36.7 C  SpO2:      Last Pain:  Vitals:   01/08/18 0820  TempSrc:   PainSc: 0-No pain   Pain Goal:                 Junious SilkGILBERT,Maguire Sime

## 2018-01-08 NOTE — Lactation Note (Addendum)
This note was copied from a baby's chart. Lactation Consultation Note  Patient Name: Victoria Romero Today's Date: 01/08/2018   Mother states she wants to formula feed.  Discussed cabbage leaves to reduce milk supply.     Maternal Data    Feeding    LATCH Score                   Interventions    Lactation Tools Discussed/Used     Consult Status      Hardie PulleyBerkelhammer, Ruth Boschen 01/08/2018, 2:37 PM

## 2018-01-08 NOTE — Addendum Note (Signed)
Addendum  created 01/08/18 0920 by Junious SilkGilbert, Mayelin Panos, CRNA   Sign clinical note

## 2018-01-08 NOTE — Progress Notes (Signed)
CSW received consult due to score 13 on Edinburgh Depression Screen.    When CSW arrived, infant was asleep in bassinet, MOB was resting in bed, and FOB was resting in the recliner. With MOB's permission, CSW asked FOB to leave the room in effort to meet with MOB in private. MOB was polite, easy to engage, and receptive to meeting with CSW. During the assessment MOB was tearful and expressed feelings of sadness.   CSW reviewed MOB's Edinburgh score and MOB explained that MOB is experiencing many life changes.  MOB shared that MOB's oldest son is moving away to college, and MOB's oldest is turing five an will be attending Kindergarten. CSW validated normalized MOB's thoughts and feelings.   CSW provided education regarding Baby Blues vs PMADs. CSW encouraged MOB to evaluate her mental health throughout the postpartum period with the use of the New Mom Checklist developed by Postpartum Progress and notify a medical professional if symptoms arise.  CSW assessed for safety an MOB denied SI and HI.  MOB reports having a good support team that consists of MOB's husband/FOB.  MOB also reported having all essential items needed for infant and feeling prepared to parent.   There are no barriers to discharge.  Blaine HamperAngel Romero, MSW, LCSW Clinical Social Work [phone]

## 2018-01-08 NOTE — Discharge Summary (Signed)
Obstetric Discharge Summary Reason for Admission: onset of labor and rupture of membranes Prenatal Procedures: none Intrapartum Procedures: spontaneous vaginal delivery Postpartum Procedures: none Complications-Operative and Postpartum: none Hemoglobin  Date Value Ref Range Status  01/08/2018 10.3 (L) 12.0 - 15.0 g/dL Final   HCT  Date Value Ref Range Status  01/08/2018 31.1 (L) 36.0 - 46.0 % Final     Discharge Diagnoses: Term Pregnancy-delivered  Discharge Information: Date: 01/08/2018 Activity: pelvic rest Diet: routine Medications: Ibuprofen Condition: stable Instructions: refer to practice specific booklet Discharge to: home Follow-up Information    Philip AspenCallahan, Sidney, DO Follow up in 4 week(s).   Specialty:  Obstetrics and Gynecology Contact information: [address] [place] [id number] [phone]           Newborn Data: Live born female  Birth Weight: 6 lb 3.3 oz (2815 g) APGAR: 8, 9  Newborn Delivery   Birth date/time:  01/07/2018 19:39:00 Delivery type:  Vaginal, Spontaneous     Home with mother.  Victoria Romero A 01/08/2018, 7:52 AM

## 2018-01-08 NOTE — Anesthesia Postprocedure Evaluation (Signed)
Anesthesia Post Note  Patient: Victoria Romero  Procedure(s) Performed: AN AD HOC LABOR EPIDURAL     Patient location during evaluation: Mother Baby Anesthesia Type: Epidural Level of consciousness: awake and alert Pain management: pain level controlled Vital Signs Assessment: post-procedure vital signs reviewed and stable Respiratory status: spontaneous breathing, nonlabored ventilation and respiratory function stable Cardiovascular status: stable Postop Assessment: no headache, no backache and epidural receding Anesthetic complications: no    Last Vitals:  Vitals:   01/07/18 2149 01/07/18 2249  BP: 111/61 114/67  Pulse: 69 72  Resp:  16  Temp: 37 C 37 C  SpO2: 99% 99%    Last Pain:  Vitals:   01/07/18 2249  TempSrc: Oral  PainSc: 0-No pain   Pain Goal:                 Hollie Bartus

## 2018-01-08 NOTE — Progress Notes (Signed)
Patient is eating, ambulating, voiding.  Pain control is good.  Vitals:   01/07/18 2149 01/07/18 2249 01/08/18 0253 01/08/18 0640  BP: 111/61 114/67 111/67 110/60  Pulse: 69 72 69 67  Resp:  16    Temp: 98.6 F (37 C) 98.6 F (37 C) 98.2 F (36.8 C) 98.1 F (36.7 C)  TempSrc: Oral Oral Oral Oral  SpO2: 99% 99%    Weight:      Height:        Fundus firm Perineum without swelling.  Lab Results  Component Value Date   WBC 12.1 (H) 01/08/2018   HGB 10.3 (L) 01/08/2018   HCT 31.1 (L) 01/08/2018   MCV 85.2 01/08/2018   PLT 194 01/08/2018    --/--/O POS (07/04 1538)/RI  A/P Post partum day 1.  Routine care.  Expect d/c routine.    [name] A

## 2018-01-09 LAB — RPR: RPR: NONREACTIVE

## 2018-01-13 ENCOUNTER — Inpatient Hospital Stay (HOSPITAL_COMMUNITY): Admission: RE | Admit: 2018-01-13 | Payer: 59 | Source: Ambulatory Visit

## 2018-01-20 ENCOUNTER — Inpatient Hospital Stay (HOSPITAL_COMMUNITY): Payer: 59

## 2018-01-20 ENCOUNTER — Inpatient Hospital Stay (HOSPITAL_COMMUNITY)
Admission: AD | Admit: 2018-01-20 | Discharge: 2018-01-20 | Disposition: A | Discharge status: Home / Self Care | Payer: 59 | Source: Ambulatory Visit | Attending: Obstetrics and Gynecology | Admitting: Obstetrics and Gynecology

## 2018-01-20 ENCOUNTER — Encounter (HOSPITAL_COMMUNITY): Payer: Self-pay

## 2018-01-20 DIAGNOSIS — R9389 Abnormal findings on diagnostic imaging of other specified body structures: Secondary | ICD-10-CM | POA: Insufficient documentation

## 2018-01-20 DIAGNOSIS — Z79899 Other long term (current) drug therapy: Secondary | ICD-10-CM | POA: Diagnosis not present

## 2018-01-20 DIAGNOSIS — N939 Abnormal uterine and vaginal bleeding, unspecified: Secondary | ICD-10-CM | POA: Diagnosis present

## 2018-01-20 DIAGNOSIS — Z9049 Acquired absence of other specified parts of digestive tract: Secondary | ICD-10-CM | POA: Insufficient documentation

## 2018-01-20 LAB — CBC
HEMATOCRIT: 37.5 % (ref 36.0–46.0)
HEMOGLOBIN: 12 g/dL (ref 12.0–15.0)
MCH: 27.8 pg (ref 26.0–34.0)
MCHC: 32 g/dL (ref 30.0–36.0)
MCV: 86.8 fL (ref 78.0–100.0)
Platelets: 265 10*3/uL (ref 150–400)
RBC: 4.32 MIL/uL (ref 3.87–5.11)
RDW: 13.6 % (ref 11.5–15.5)
WBC: 9.3 10*3/uL (ref 4.0–10.5)

## 2018-01-20 NOTE — MAU Note (Signed)
Pt passed several golf ball size clots last night, then it stopped.  Had large gush of blood around 1500, soaked through clothes & into seat of car.  Was advised by MD to come to MAU.  Having some pelvic pressure.  Vaginal delivery on 7/4.

## 2018-01-20 NOTE — MAU Provider Note (Signed)
History     CSN: [id number]  Arrival date and time: 01/20/18 1629   First Provider Initiated Contact with Patient 01/20/18 1714      Chief Complaint  Patient presents with  . Vaginal Bleeding   HPI  Ms.  Victoria Romero is a 37 y.o. year old [id number] female at 2 days s/p vaginal delivery on 01/07/2018 who presents to MAU reporting passing "golf ball sized" clots last night, but then stopped bleeding. She then reports she had a gush of blood after getting out of her car today around 1500. She called her OB office after last night's episode of bleeding and was told that sx's may be normal, but to call back if it continues. She also reports some pelvic pressure. She denies any over exertion yesterday or today. The only activity outside of her home was to go to the beauty salon.  * Last eaten & drank @ 1430  Past Medical History:  Diagnosis Date  . Vaginal Pap smear, abnormal     Past Surgical History:  Procedure Laterality Date  . CERVICAL CONE BIOPSY  2003  . CHOLECYSTECTOMY    . LEEP  2002  . TONSILLECTOMY      Family History  Problem Relation Age of Onset  . Heart disease Mother     Social History   Tobacco Use  . Smoking status: Never Smoker  . Smokeless tobacco: Never Used  Substance Use Topics  . Alcohol use: No  . Drug use: No    Allergies: No Known Allergies  Medications Prior to Admission  Medication Sig Dispense Refill Last Dose  . cetirizine (ZYRTEC) 10 MG tablet Take 10 mg by mouth daily as needed for allergies.   Past Week at Unknown time  . docusate sodium (COLACE) 100 MG capsule Take 1 capsule (100 mg total) by mouth every 12 (twelve) hours. (Patient not taking: Reported on 01/07/2018) 60 capsule 0 Not Taking at Unknown time  . Prenatal MV & Min w/FA-DHA (PRENATAL ADULT GUMMY/DHA/FA PO) Take 2 tablets by mouth daily.   01/06/2018 at Unknown time    Review of Systems  Constitutional: Negative.   HENT: Negative.   Eyes: Negative.   Respiratory:  Negative.   Cardiovascular: Negative.   Gastrointestinal: Positive for abdominal pain (upper abdominal soreness/pain).  Endocrine: Negative.   Genitourinary: Positive for pelvic pain (pressure) and vaginal bleeding (clots and gushes of blood yesterday and today).  Musculoskeletal: Negative.   Skin: Negative.   Allergic/Immunologic: Negative.   Neurological: Negative.   Hematological: Negative.   Psychiatric/Behavioral: Negative.    Physical Exam   Blood pressure 114/70, pulse (!) 104, temperature 98.1 F (36.7 C), temperature source Oral, resp. rate 16, weight 81.6 kg (180 lb).  Physical Exam  Nursing note and vitals reviewed. Constitutional: She is oriented to person, place, and time. She appears well-developed and well-nourished.  HENT:  Head: Normocephalic and atraumatic.  Eyes: Pupils are equal, round, and reactive to light.  Neck: Normal range of motion.  Cardiovascular: Normal rate.  Respiratory: Effort normal.  GI: Soft.  Genitourinary:  Genitourinary Comments: Uterus: slightly enlarged, SE: cervix is smooth, pink, no lesions, small amt of dark rubra appearing lochia seen in posterior fornix  Musculoskeletal: Normal range of motion.  Neurological: She is alert and oriented to person, place, and time.  Skin: Skin is warm and dry.  Psychiatric: She has a normal mood and affect. Her behavior is normal. Judgment and thought content normal.    MAU Course  Procedures  MDM Speculum Exam CBC  *Consult with Dr. Claiborne Billingsallahan @ 1532 - notified of patient's complaints, assessments, CBC pending and requests of what testing she would like ordered for the patient. Recommended orders: pelvic U/S to check for retained products along with CBC // Update to Dr. Claiborne Billingsallahan @ 1915: advise pt of options: Cytotec 800 mcg rectally or D&C // Dr. Claiborne Billingsallahan at bedside @ 2015  Results for orders placed or performed during the hospital encounter of 01/20/18 (from the past 24 hour(s))  CBC     Status:  None   Collection Time: 01/20/18  5:30 PM  Result Value Ref Range   WBC 9.3 4.0 - 10.5 K/uL   RBC 4.32 3.87 - 5.11 MIL/uL   Hemoglobin 12.0 12.0 - 15.0 g/dL   HCT 91.437.5 78.236.0 - 95.646.0 %   MCV 86.8 78.0 - 100.0 fL   MCH 27.8 26.0 - 34.0 pg   MCHC 32.0 30.0 - 36.0 g/dL   RDW 21.313.6 08.611.5 - 57.815.5 %   Platelets 265 150 - 400 K/uL   Assessment and Plan  Bleeding postpartum  - Dr. Claiborne Billingsallahan assumes care of patient @ [address]2015  Kendelle Schweers, MSN, CNM 01/20/2018, 5:27 PM

## 2018-01-21 ENCOUNTER — Other Ambulatory Visit: Payer: Self-pay

## 2018-01-21 ENCOUNTER — Encounter (HOSPITAL_COMMUNITY)
Admission: AD | Disposition: A | Discharge status: Home / Self Care | Payer: Self-pay | Source: Ambulatory Visit | Attending: Obstetrics and Gynecology

## 2018-01-21 ENCOUNTER — Ambulatory Visit (HOSPITAL_COMMUNITY): Payer: 59 | Admitting: Anesthesiology

## 2018-01-21 ENCOUNTER — Ambulatory Visit: Admit: 2018-01-21 | Payer: 59 | Admitting: Obstetrics and Gynecology

## 2018-01-21 ENCOUNTER — Ambulatory Visit (HOSPITAL_COMMUNITY)
Admission: AD | Admit: 2018-01-21 | Discharge: 2018-01-21 | Disposition: A | Discharge status: Home / Self Care | Payer: 59 | Source: Ambulatory Visit | Attending: Obstetrics and Gynecology | Admitting: Obstetrics and Gynecology

## 2018-01-21 ENCOUNTER — Encounter (HOSPITAL_COMMUNITY): Payer: Self-pay

## 2018-01-21 HISTORY — PX: DILATION AND EVACUATION: SHX1459

## 2018-01-21 SURGERY — DILATION AND EVACUATION, UTERUS
Anesthesia: Choice

## 2018-01-21 SURGERY — DILATION AND EVACUATION, UTERUS
Anesthesia: General

## 2018-01-21 MED ORDER — LIDOCAINE HCL (CARDIAC) PF 100 MG/5ML IV SOSY
PREFILLED_SYRINGE | INTRAVENOUS | Status: AC
  Filled 2018-01-21: qty 5

## 2018-01-21 MED ORDER — LIDOCAINE HCL 1 % IJ SOLN
INTRAMUSCULAR | Status: AC
  Filled 2018-01-21: qty 20

## 2018-01-21 MED ORDER — PROPOFOL 10 MG/ML IV BOLUS
INTRAVENOUS | Status: AC
  Filled 2018-01-21: qty 20

## 2018-01-21 MED ORDER — SUGAMMADEX SODIUM 200 MG/2ML IV SOLN
INTRAVENOUS | Status: AC
  Filled 2018-01-21: qty 2

## 2018-01-21 MED ORDER — MIDAZOLAM HCL 2 MG/2ML IJ SOLN
INTRAMUSCULAR | Status: AC
  Filled 2018-01-21: qty 2

## 2018-01-21 MED ORDER — OXYCODONE-ACETAMINOPHEN 5-325 MG PO TABS: 1.0000 | ORAL_TABLET | Freq: Four times a day (QID) | ORAL | Status: DC | PRN

## 2018-01-21 MED ORDER — ONDANSETRON HCL 4 MG/2ML IJ SOLN
INTRAMUSCULAR | Status: AC
  Filled 2018-01-21: qty 2

## 2018-01-21 MED ORDER — ONDANSETRON HCL 4 MG/2ML IJ SOLN
INTRAMUSCULAR | Status: DC | PRN
  Administered 2018-01-21: 4 mg via INTRAVENOUS

## 2018-01-21 MED ORDER — ROCURONIUM BROMIDE 100 MG/10ML IV SOLN
INTRAVENOUS | Status: DC | PRN
  Administered 2018-01-21: 30 mg via INTRAVENOUS

## 2018-01-21 MED ORDER — LIDOCAINE HCL 1 % IJ SOLN
INTRAMUSCULAR | Status: DC | PRN
  Administered 2018-01-21: 10 mL

## 2018-01-21 MED ORDER — MIDAZOLAM HCL 2 MG/2ML IJ SOLN
INTRAMUSCULAR | Status: DC | PRN
  Administered 2018-01-21: 2 mg via INTRAVENOUS

## 2018-01-21 MED ORDER — LACTATED RINGERS IV SOLN
INTRAVENOUS | Status: DC
  Administered 2018-01-21 (×2): via INTRAVENOUS

## 2018-01-21 MED ORDER — FENTANYL CITRATE (PF) 100 MCG/2ML IJ SOLN
INTRAMUSCULAR | Status: DC | PRN
  Administered 2018-01-21 (×2): 50 ug via INTRAVENOUS

## 2018-01-21 MED ORDER — KETOROLAC TROMETHAMINE 30 MG/ML IJ SOLN
INTRAMUSCULAR | Status: DC | PRN
  Administered 2018-01-21: 30 mg via INTRAVENOUS

## 2018-01-21 MED ORDER — SUGAMMADEX SODIUM 200 MG/2ML IV SOLN
INTRAVENOUS | Status: DC | PRN
  Administered 2018-01-21: 200 mg via INTRAVENOUS

## 2018-01-21 MED ORDER — DEXAMETHASONE SODIUM PHOSPHATE 10 MG/ML IJ SOLN
INTRAMUSCULAR | Status: DC | PRN
  Administered 2018-01-21: 4 mg via INTRAVENOUS

## 2018-01-21 MED ORDER — OXYCODONE-ACETAMINOPHEN 5-325 MG PO TABS: 1.0000 | ORAL_TABLET | Freq: Four times a day (QID) | ORAL | 0 refills | Status: DC | PRN

## 2018-01-21 MED ORDER — PROMETHAZINE HCL 25 MG/ML IJ SOLN: 6.2500 mg | INTRAMUSCULAR | Status: DC | PRN

## 2018-01-21 MED ORDER — SCOPOLAMINE 1 MG/3DAYS TD PT72
MEDICATED_PATCH | TRANSDERMAL | Status: AC
  Administered 2018-01-21: 1.5 mg via TRANSDERMAL
  Filled 2018-01-21: qty 1

## 2018-01-21 MED ORDER — SCOPOLAMINE 1 MG/3DAYS TD PT72
1.0000 | MEDICATED_PATCH | Freq: Once | TRANSDERMAL | Status: DC
  Administered 2018-01-21: 1.5 mg via TRANSDERMAL

## 2018-01-21 MED ORDER — LIDOCAINE HCL (CARDIAC) PF 100 MG/5ML IV SOSY
PREFILLED_SYRINGE | INTRAVENOUS | Status: DC | PRN
  Administered 2018-01-21: 50 mg via INTRAVENOUS

## 2018-01-21 MED ORDER — PROPOFOL 10 MG/ML IV BOLUS
INTRAVENOUS | Status: DC | PRN
  Administered 2018-01-21: 200 mg via INTRAVENOUS

## 2018-01-21 MED ORDER — FENTANYL CITRATE (PF) 100 MCG/2ML IJ SOLN: 25.0000 ug | INTRAMUSCULAR | Status: DC | PRN

## 2018-01-21 MED ORDER — FENTANYL CITRATE (PF) 100 MCG/2ML IJ SOLN
INTRAMUSCULAR | Status: AC
  Filled 2018-01-21: qty 2

## 2018-01-21 SURGICAL SUPPLY — 19 items
CATH ROBINSON RED A/P 16FR (CATHETERS) ×3 IMPLANT
DECANTER SPIKE VIAL GLASS SM (MISCELLANEOUS) ×3 IMPLANT
GLOVE BIOGEL PI IND STRL 6.5 (GLOVE) ×1 IMPLANT
GLOVE BIOGEL PI IND STRL 7.0 (GLOVE) ×1 IMPLANT
GLOVE BIOGEL PI INDICATOR 6.5 (GLOVE) ×2
GLOVE BIOGEL PI INDICATOR 7.0 (GLOVE) ×2
GLOVE ECLIPSE 6.5 STRL STRAW (GLOVE) ×3 IMPLANT
GOWN STRL REUS W/TWL LRG LVL3 (GOWN DISPOSABLE) ×6 IMPLANT
KIT BERKELEY 1ST TRIMESTER 3/8 (MISCELLANEOUS) ×3 IMPLANT
NS IRRIG 1000ML POUR BTL (IV SOLUTION) ×3 IMPLANT
PACK VAGINAL MINOR WOMEN LF (CUSTOM PROCEDURE TRAY) ×3 IMPLANT
PAD OB MATERNITY 4.3X12.25 (PERSONAL CARE ITEMS) ×3 IMPLANT
PAD PREP 24X48 CUFFED NSTRL (MISCELLANEOUS) ×3 IMPLANT
SET BERKELEY SUCTION TUBING (SUCTIONS) ×3 IMPLANT
TOWEL OR 17X24 6PK STRL BLUE (TOWEL DISPOSABLE) ×6 IMPLANT
VACURETTE 10 RIGID CVD (CANNULA) ×2 IMPLANT
VACURETTE 7MM CVD STRL WRAP (CANNULA) IMPLANT
VACURETTE 8 RIGID CVD (CANNULA) IMPLANT
VACURETTE 9 RIGID CVD (CANNULA) IMPLANT

## 2018-01-21 NOTE — Anesthesia Procedure Notes (Signed)
Procedure Name: Intubation Date/Time: 01/21/2018 2:16 PM Performed by: Cleda ClarksBrowder, Torrion Witter R, CRNA Pre-anesthesia Checklist: Patient identified, Emergency Drugs available, Suction available and Patient being monitored Patient Re-evaluated:Patient Re-evaluated prior to induction Oxygen Delivery Method: Circle system utilized Preoxygenation: Pre-oxygenation with 100% oxygen Induction Type: IV induction Ventilation: Mask ventilation without difficulty Laryngoscope Size: Miller and 2 Grade View: Grade I Tube type: Oral Tube size: 7.0 mm Number of attempts: 1 Airway Equipment and Method: Stylet and Oral airway Placement Confirmation: ETT inserted through vocal cords under direct vision,  positive ETCO2 and breath sounds checked- equal and bilateral Secured at: 21 cm Tube secured with: Tape Dental Injury: Teeth and Oropharynx as per pre-operative assessment

## 2018-01-21 NOTE — Brief Op Note (Signed)
f7/18/2019  2:40 PM  PATIENT:  Ileene MusaParker S Hawker  37 y.o. female  PRE-OPERATIVE DIAGNOSIS:  RETAINED PRODUCTS OF CONCEPTION  POST-OPERATIVE DIAGNOSIS:  RETAINED PRODUCTS OF CONCEPTION  PROCEDURE:  Procedure(s): DILATATION AND EVACUATION (N/A)  SURGEON:  Surgeon(s) and Role:    Claiborne Billings* Charlyne Robertshaw, DO - Primary  ANESTHESIA:   general and IV sedation  EBL:  100 mL   LOCAL MEDICATIONS USED:  XYLOCAINE  and Amount: 10 ml  SPECIMEN:  Source of Specimen:  RPOC  DISPOSITION OF SPECIMEN:  PATHOLOGY  COUNTS:  YES  TOURNIQUET:  * No tourniquets in log *  PLAN OF CARE: Discharge to home after PACU  PATIENT DISPOSITION:  PACU - hemodynamically stable.   Delay start of Pharmacological VTE agent (>24hrs) due to surgical blood loss or risk of bleeding: not applicable

## 2018-01-21 NOTE — H&P (Signed)
37 y.o. presents with heavy vaginal bleeding, s/p uncomplicated vaginal delivery 01/07/2018.  She reports passing clots and saoking pads that subsided, then yesterday had episode of large gush of blood that soaked pad, clothes and car seat.  She proceeded to MAU for eval and US showed suspected fundal RPOC.  Discussed options of expm mgmt in setting of normal vital signs, afebrile, normal WBCs and Hb of 12, vs. cytotec vs D&E.  After expensive discussion of risks/benefits of each pt chooses to proceed with D&E.  Pt discharged home last night to return today for schedule d&E.  Past Medical History:  Diagnosis Date  . Vaginal Pap smear, abnormal    Past Surgical History:  Procedure Laterality Date  . CERVICAL CONE BIOPSY  2003  . CHOLECYSTECTOMY    . LEEP  2002  . TONSILLECTOMY      Social History   Socioeconomic History  . Marital status: Married    Spouse name: Not on file  . Number of children: Not on file  . Years of education: Not on file  . Highest education level: Not on file  Occupational History  . Not on file  Social Needs  . Financial resource strain: Not on file  . Food insecurity:    Worry: Not on file    Inability: Not on file  . Transportation needs:    Medical: Not on file    Non-medical: Not on file  Tobacco Use  . Smoking status: Never Smoker  . Smokeless tobacco: Never Used  Substance and Sexual Activity  . Alcohol use: No  . Drug use: No  . Sexual activity: Not Currently  Lifestyle  . Physical activity:    Days per week: Not on file    Minutes per session: Not on file  . Stress: Not on file  Relationships  . Social connections:    Talks on phone: Not on file    Gets together: Not on file    Attends religious service: Not on file    Active member of club or organization: Not on file    Attends meetings of clubs or organizations: Not on file    Relationship status: Not on file  . Intimate partner violence:    Fear of current or ex partner: Not on  file    Emotionally abused: Not on file    Physically abused: Not on file    Forced sexual activity: Not on file  Other Topics Concern  . Not on file  Social History Narrative  . Not on file    No current facility-administered medications on file prior to encounter.    Current Outpatient Medications on File Prior to Encounter  Medication Sig Dispense Refill  . cetirizine (ZYRTEC) 10 MG tablet Take 10 mg by mouth daily as needed for allergies.    Marland Kitchen. docusate sodium (COLACE) 100 MG capsule Take 1 capsule (100 mg total) by mouth every 12 (twelve) hours. (Patient not taking: Reported on 01/07/2018) 60 capsule 0  . Prenatal MV & Min w/FA-DHA (PRENATAL ADULT GUMMY/DHA/FA PO) Take 2 tablets by mouth daily.      No Known Allergies  There were no vitals filed for this visit.  Gen: NAD Abdomen:  soft, nontender, nondistended. Ex:  no cords, erythema Pelvic:  Deferred to OR  A:  Suspected RPOC   P: P: All risks, benefits and alternatives d/w patient and she desires to proceed as above. Routine pre-op care.Claiborne Billings.  Claudius Mich, Luther ParodySIDNEY

## 2018-01-21 NOTE — Transfer of Care (Signed)
Immediate Anesthesia Transfer of Care Note  Patient: Victoria Romero  Procedure(s) Performed: DILATATION AND EVACUATION (N/A )  Patient Location: PACU  Anesthesia Type:General  Level of Consciousness: awake, alert  and oriented  Airway & Oxygen Therapy: Patient Spontanous Breathing and Patient connected to nasal cannula oxygen  Post-op Assessment: Report given to RN and Post -op Vital signs reviewed and stable  Post vital signs: Reviewed and stable  Last Vitals:  Vitals Value Taken Time  BP 92/68 01/21/2018  2:50 PM  Temp    Pulse 70 01/21/2018  2:52 PM  Resp 13 01/21/2018  2:52 PM  SpO2 96 % 01/21/2018  2:52 PM  Vitals shown include unvalidated device data.  Last Pain:  Vitals:   01/21/18 1333  TempSrc: Oral  PainSc: 1       Patients Stated Pain Goal: 4 (01/21/18 1333)  Complications: No apparent anesthesia complications

## 2018-01-21 NOTE — Anesthesia Preprocedure Evaluation (Signed)
Anesthesia Evaluation  Patient identified by MRN, date of birth, ID band Patient awake    Reviewed: Allergy & Precautions, NPO status , Patient's Chart, lab work & pertinent test results  Airway Mallampati: II  TM Distance: >3 FB Neck ROM: Full    Dental  (+) Dental Advisory Given   Pulmonary neg pulmonary ROS,    breath sounds clear to auscultation       Cardiovascular negative cardio ROS   Rhythm:Regular Rate:Normal     Neuro/Psych negative neurological ROS     GI/Hepatic negative GI ROS, Neg liver ROS,   Endo/Other  negative endocrine ROS  Renal/GU negative Renal ROS     Musculoskeletal   Abdominal   Peds  Hematology negative hematology ROS (+)   Anesthesia Other Findings   Reproductive/Obstetrics                             Anesthesia Physical Anesthesia Plan  ASA: II  Anesthesia Plan: General   Post-op Pain Management:    Induction: Intravenous  PONV Risk Score and Plan: 4 or greater and Dexamethasone, Ondansetron, Scopolamine patch - Pre-op, Treatment may vary due to age or medical condition and Midazolam  Airway Management Planned: Oral ETT  Additional Equipment:   Intra-op Plan:   Post-operative Plan: Extubation in OR  Informed Consent: I have reviewed the patients History and Physical, chart, labs and discussed the procedure including the risks, benefits and alternatives for the proposed anesthesia with the patient or authorized representative who has indicated his/her understanding and acceptance.   Dental advisory given  Plan Discussed with: CRNA  Anesthesia Plan Comments:         Anesthesia Quick Evaluation

## 2018-01-21 NOTE — Anesthesia Postprocedure Evaluation (Signed)
Anesthesia Post Note  Patient: Victoria Romero  Procedure(s) Performed: DILATATION AND EVACUATION (N/A )     Patient location during evaluation: PACU Anesthesia Type: General Level of consciousness: awake and alert Pain management: pain level controlled Vital Signs Assessment: post-procedure vital signs reviewed and stable Respiratory status: spontaneous breathing, nonlabored ventilation, respiratory function stable and patient connected to nasal cannula oxygen Cardiovascular status: blood pressure returned to baseline and stable Postop Assessment: no apparent nausea or vomiting Anesthetic complications: no    Last Vitals:  Vitals:   01/21/18 1545 01/21/18 1615  BP: 92/69 90/67  Pulse: (!) 55 60  Resp: 11 16  Temp:  36.7 C  SpO2: 100% 95%    Last Pain:  Vitals:   01/21/18 1615  TempSrc:   PainSc: 0-No pain   Pain Goal: Patients Stated Pain Goal: 4 (01/21/18 1333)               Kennieth RadFitzgerald, Saleem Coccia E

## 2018-01-21 NOTE — Discharge Instructions (Signed)
DISCHARGE INSTRUCTIONS: D&C / D&E The following instructions have been prepared to help you care for yourself upon your return home.   Personal hygiene:  Use sanitary pads for vaginal drainage, not tampons.  Shower the day after your procedure.  NO tub baths, pools or Jacuzzis for 2-3 weeks.  Wipe front to back after using the bathroom.  Activity and limitations:  Do NOT drive or operate any equipment for 24 hours. The effects of anesthesia are still present and drowsiness may result.  Do NOT rest in bed all day.  Walking is encouraged.  Walk up and down stairs slowly.  You may resume your normal activity in one to two days or as indicated by your physician.  Sexual activity: NO intercourse for at least 2 weeks after the procedure, or as indicated by your physician.  Diet: Eat a light meal as desired this evening. You may resume your usual diet tomorrow.  Return to work: You may resume your work activities in one to two days or as indicated by your doctor.  What to expect after your surgery: Expect to have vaginal bleeding/discharge for 2-3 days and spotting for up to 10 days. It is not unusual to have soreness for up to 1-2 weeks. You may have a slight burning sensation when you urinate for the first day. Mild cramps may continue for a couple of days. You may have a regular period in 2-6 weeks.  Call your doctor for any of the following:  Excessive vaginal bleeding, saturating and changing one pad every hour.  Inability to urinate 6 hours after discharge from hospital.  Pain not relieved by pain medication.  Fever of 100.4 F or greater.  Unusual vaginal discharge or odor.   Call for an appointment:    Patients signature: ______________________  Nurses signature ________________________  Support person's signature_______________________     Post Anesthesia Home Care Instructions  Activity: Get plenty of rest for the remainder of the day. A responsible  individual must stay with you for 24 hours following the procedure.  For the next 24 hours, DO NOT: -Drive a car -Advertising copywriterperate machinery -Drink alcoholic beverages -Take any medication unless instructed by your physician -Make any legal decisions or sign important papers.  Meals: Start with liquid foods such as gelatin or soup. Progress to regular foods as tolerated. Avoid greasy, spicy, heavy foods. If nausea and/or vomiting occur, drink only clear liquids until the nausea and/or vomiting subsides. Call your physician if vomiting continues.  Special Instructions/Symptoms: Your throat may feel dry or sore from the anesthesia or the breathing tube placed in your throat during surgery. If this causes discomfort, gargle with warm salt water. The discomfort should disappear within 24 hours.  If you had a scopolamine patch placed behind your ear for the management of post- operative nausea and/or vomiting:  1. The medication in the patch is effective for 72 hours, after which it should be removed.  Wrap patch in a tissue and discard in the trash. Wash hands thoroughly with soap and water. 2. You may remove the patch earlier than 72 hours if you experience unpleasant side effects which may include dry mouth, dizziness or visual disturbances. 3. Avoid touching the patch. Wash your hands with soap and water after contact with the patch.    NO IBUPROFEN PRODUCTS (MOTRIN, ADVIL) OR ALEVE UNTIL 8:30PM TODAY.

## 2018-01-22 ENCOUNTER — Encounter (HOSPITAL_COMMUNITY): Payer: Self-pay | Admitting: Obstetrics and Gynecology

## 2018-01-22 NOTE — Op Note (Signed)
NAME: Victoria Romero, Victoria S. MEDICAL RECORD [id number] ACCOUNT [id number] DATE OF BIRTH:1981-06-12 FACILITY: [facility] LOCATION: [facility] PHYSICIAN:Kyannah Climer M. Claiborne BillingsALLAHAN, DO  OPERATIVE REPORT  DATE OF PROCEDURE:    PREOPERATIVE DIAGNOSIS:  Status post vaginal delivery, retained products of conception.  POSTOPERATIVE DIAGNOSIS:  Status post vaginal delivery, retained products of conception.  PROCEDURE:  Dilation and evacuation.  SURGEON:  Philip AspenSidney Joeph Szatkowski, DO  ANESTHESIA:  General and local.  ESTIMATED BLOOD LOSS:  100 mL.  LOCAL MEDICATIONS:  10 mL of Xylocaine for paracervical block.  SPECIMENS:  Retained products of conception.  URINE OUTPUT:  Approximately 100 mL preop catheterized.  FINDINGS:  Moderate decidual tissue from the fundus and blood clot evacuated from the uterus and sent to pathology.  DESCRIPTION OF PROCEDURE:  The patient was taken to the operating room where anesthesia was administered and found to be adequate.  She was prepped and draped in the normal sterile fashion in dorsal lithotomy position.  The patient was catheterized and a  speculum was placed in the vagina to visualize the cervix.  The anterior lip of the cervix was grasped with a single tooth tenaculum and Xylocaine administered paracervically.  The cervix was easily dilated to 31 Pratt and a 10 curved suction curette  was advanced gently to the fundus.  This was placed on suction and when adequate suction was noted, curettage was performed.  As noted above, moderate tissue was removed.  A second pass was performed followed by sharp gentle curettage of all four  quadrants showing residual material.  All instruments were removed.  Tenaculum was removed and tenaculum sites hemostatic with minimal pressure.  Speculum removed.  The patient tolerated procedure well.  Sponge, lap and needle counts were correct x2.   The patient was taken to recovery in stable condition.  TN/NUANCE  D:01/21/2018 T:01/21/2018  JOB:[id number]

## 2018-03-21 ENCOUNTER — Emergency Department (HOSPITAL_COMMUNITY): Payer: 59

## 2018-03-21 ENCOUNTER — Encounter (HOSPITAL_COMMUNITY): Payer: Self-pay | Admitting: Emergency Medicine

## 2018-03-21 ENCOUNTER — Observation Stay (HOSPITAL_COMMUNITY)
Admission: EM | Admit: 2018-03-21 | Discharge: 2018-03-22 | Disposition: A | Discharge status: Home / Self Care | Payer: 59 | Attending: Surgery | Admitting: Surgery

## 2018-03-21 ENCOUNTER — Emergency Department (HOSPITAL_COMMUNITY): Payer: 59 | Admitting: Certified Registered Nurse Anesthetist

## 2018-03-21 ENCOUNTER — Other Ambulatory Visit: Payer: Self-pay

## 2018-03-21 ENCOUNTER — Encounter (HOSPITAL_COMMUNITY)
Admission: EM | Disposition: A | Discharge status: Home / Self Care | Payer: Self-pay | Source: Home / Self Care | Attending: Emergency Medicine

## 2018-03-21 DIAGNOSIS — R197 Diarrhea, unspecified: Secondary | ICD-10-CM | POA: Diagnosis not present

## 2018-03-21 DIAGNOSIS — R112 Nausea with vomiting, unspecified: Secondary | ICD-10-CM | POA: Insufficient documentation

## 2018-03-21 DIAGNOSIS — R102 Pelvic and perineal pain: Secondary | ICD-10-CM | POA: Diagnosis not present

## 2018-03-21 DIAGNOSIS — Z9049 Acquired absence of other specified parts of digestive tract: Secondary | ICD-10-CM | POA: Diagnosis not present

## 2018-03-21 DIAGNOSIS — Q433 Congenital malformations of intestinal fixation: Secondary | ICD-10-CM | POA: Diagnosis not present

## 2018-03-21 DIAGNOSIS — Z79899 Other long term (current) drug therapy: Secondary | ICD-10-CM | POA: Insufficient documentation

## 2018-03-21 DIAGNOSIS — K56609 Unspecified intestinal obstruction, unspecified as to partial versus complete obstruction: Secondary | ICD-10-CM | POA: Diagnosis present

## 2018-03-21 DIAGNOSIS — K458 Other specified abdominal hernia without obstruction or gangrene: Secondary | ICD-10-CM | POA: Diagnosis present

## 2018-03-21 HISTORY — PX: LAPAROSCOPY: SHX197

## 2018-03-21 LAB — TYPE AND SCREEN
ABO/RH(D): O POS
Antibody Screen: NEGATIVE

## 2018-03-21 LAB — CBC WITH DIFFERENTIAL/PLATELET
Basophils Absolute: 0 10*3/uL (ref 0.0–0.1)
Basophils Relative: 0 %
EOS ABS: 0.2 10*3/uL (ref 0.0–0.7)
Eosinophils Relative: 3 %
HEMATOCRIT: 39.8 % (ref 36.0–46.0)
HEMOGLOBIN: 12.9 g/dL (ref 12.0–15.0)
Lymphocytes Relative: 38 %
Lymphs Abs: 2.7 10*3/uL (ref 0.7–4.0)
MCH: 27.6 pg (ref 26.0–34.0)
MCHC: 32.4 g/dL (ref 30.0–36.0)
MCV: 85.2 fL (ref 78.0–100.0)
MONO ABS: 0.6 10*3/uL (ref 0.1–1.0)
MONOS PCT: 8 %
NEUTROS ABS: 3.6 10*3/uL (ref 1.7–7.7)
NEUTROS PCT: 51 %
Platelets: 293 10*3/uL (ref 150–400)
RBC: 4.67 MIL/uL (ref 3.87–5.11)
RDW: 14.6 % (ref 11.5–15.5)
WBC: 7.1 10*3/uL (ref 4.0–10.5)

## 2018-03-21 LAB — URINALYSIS, ROUTINE W REFLEX MICROSCOPIC
BACTERIA UA: NONE SEEN
BILIRUBIN URINE: NEGATIVE
GLUCOSE, UA: NEGATIVE mg/dL
KETONES UR: NEGATIVE mg/dL
LEUKOCYTES UA: NEGATIVE
NITRITE: NEGATIVE
PH: 5 (ref 5.0–8.0)
Protein, ur: NEGATIVE mg/dL
SPECIFIC GRAVITY, URINE: 1.024 (ref 1.005–1.030)

## 2018-03-21 LAB — I-STAT CHEM 8, ED
BUN: 13 mg/dL (ref 6–20)
CALCIUM ION: 1.17 mmol/L (ref 1.15–1.40)
CHLORIDE: 108 mmol/L (ref 98–111)
CREATININE: 0.8 mg/dL (ref 0.44–1.00)
Glucose, Bld: 83 mg/dL (ref 70–99)
HCT: 36 % (ref 36.0–46.0)
Hemoglobin: 12.2 g/dL (ref 12.0–15.0)
Potassium: 3.6 mmol/L (ref 3.5–5.1)
SODIUM: 140 mmol/L (ref 135–145)
TCO2: 22 mmol/L (ref 22–32)

## 2018-03-21 LAB — ABO/RH: ABO/RH(D): O POS

## 2018-03-21 LAB — PREGNANCY, URINE: Preg Test, Ur: NEGATIVE

## 2018-03-21 SURGERY — LAPAROSCOPY, DIAGNOSTIC
Anesthesia: General

## 2018-03-21 MED ORDER — DEXAMETHASONE SODIUM PHOSPHATE 10 MG/ML IJ SOLN
INTRAMUSCULAR | Status: DC | PRN
  Administered 2018-03-21: 10 mg via INTRAVENOUS

## 2018-03-21 MED ORDER — ONDANSETRON 4 MG PO TBDP: 4.0000 mg | ORAL_TABLET | Freq: Four times a day (QID) | ORAL | Status: DC | PRN

## 2018-03-21 MED ORDER — BUPIVACAINE HCL (PF) 0.25 % IJ SOLN
INTRAMUSCULAR | Status: DC | PRN
  Administered 2018-03-21: 30 mL

## 2018-03-21 MED ORDER — ONDANSETRON HCL 4 MG/2ML IJ SOLN
INTRAMUSCULAR | Status: DC | PRN
  Administered 2018-03-21: 4 mg via INTRAVENOUS

## 2018-03-21 MED ORDER — SUCCINYLCHOLINE CHLORIDE 200 MG/10ML IV SOSY
PREFILLED_SYRINGE | INTRAVENOUS | Status: DC | PRN
  Administered 2018-03-21: 120 mg via INTRAVENOUS

## 2018-03-21 MED ORDER — HYDROCODONE-ACETAMINOPHEN 5-325 MG PO TABS
1.0000 | ORAL_TABLET | ORAL | Status: DC | PRN
  Administered 2018-03-22 (×2): 1 via ORAL
  Filled 2018-03-21 (×2): qty 1

## 2018-03-21 MED ORDER — FENTANYL CITRATE (PF) 250 MCG/5ML IJ SOLN
INTRAMUSCULAR | Status: AC
  Filled 2018-03-21: qty 5

## 2018-03-21 MED ORDER — MORPHINE SULFATE (PF) 2 MG/ML IV SOLN
1.0000 mg | INTRAVENOUS | Status: DC | PRN
  Administered 2018-03-21 – 2018-03-22 (×2): 2 mg via INTRAVENOUS
  Filled 2018-03-21 (×2): qty 1

## 2018-03-21 MED ORDER — LACTATED RINGERS IV SOLN
INTRAVENOUS | Status: DC
  Administered 2018-03-21 (×2): via INTRAVENOUS

## 2018-03-21 MED ORDER — SERTRALINE HCL 50 MG PO TABS
50.0000 mg | ORAL_TABLET | Freq: Every day | ORAL | Status: DC
  Administered 2018-03-22: 50 mg via ORAL
  Filled 2018-03-21: qty 1

## 2018-03-21 MED ORDER — PROMETHAZINE HCL 25 MG/ML IJ SOLN: 6.2500 mg | INTRAMUSCULAR | Status: DC | PRN

## 2018-03-21 MED ORDER — MIDAZOLAM HCL 2 MG/2ML IJ SOLN
INTRAMUSCULAR | Status: AC
  Filled 2018-03-21: qty 2

## 2018-03-21 MED ORDER — PROPOFOL 10 MG/ML IV BOLUS
INTRAVENOUS | Status: AC
  Filled 2018-03-21: qty 20

## 2018-03-21 MED ORDER — SODIUM CHLORIDE 0.9 % IR SOLN
Status: DC | PRN
  Administered 2018-03-21: 1000 mL

## 2018-03-21 MED ORDER — BUPIVACAINE HCL (PF) 0.25 % IJ SOLN
INTRAMUSCULAR | Status: AC
  Filled 2018-03-21: qty 30

## 2018-03-21 MED ORDER — GLYCOPYRROLATE PF 0.2 MG/ML IJ SOSY
PREFILLED_SYRINGE | INTRAMUSCULAR | Status: DC | PRN
  Administered 2018-03-21: 0.6 mg via INTRAVENOUS

## 2018-03-21 MED ORDER — SCOPOLAMINE 1 MG/3DAYS TD PT72
MEDICATED_PATCH | TRANSDERMAL | Status: DC | PRN
  Administered 2018-03-21: 1 via TRANSDERMAL

## 2018-03-21 MED ORDER — HYDROMORPHONE HCL 1 MG/ML IJ SOLN
INTRAMUSCULAR | Status: AC
  Filled 2018-03-21: qty 1

## 2018-03-21 MED ORDER — SCOPOLAMINE 1 MG/3DAYS TD PT72
MEDICATED_PATCH | TRANSDERMAL | Status: AC
  Filled 2018-03-21: qty 1

## 2018-03-21 MED ORDER — PROPOFOL 10 MG/ML IV BOLUS
INTRAVENOUS | Status: DC | PRN
  Administered 2018-03-21: 160 mg via INTRAVENOUS

## 2018-03-21 MED ORDER — NEOSTIGMINE METHYLSULFATE 10 MG/10ML IV SOLN
INTRAVENOUS | Status: DC | PRN
  Administered 2018-03-21: 4 mg via INTRAVENOUS

## 2018-03-21 MED ORDER — HYDROMORPHONE HCL 1 MG/ML IJ SOLN
0.2500 mg | INTRAMUSCULAR | Status: DC | PRN
  Administered 2018-03-21 (×3): 0.25 mg via INTRAVENOUS

## 2018-03-21 MED ORDER — MEPERIDINE HCL 50 MG/ML IJ SOLN
6.2500 mg | INTRAMUSCULAR | Status: DC | PRN
  Administered 2018-03-21 (×2): 12.5 mg via INTRAVENOUS

## 2018-03-21 MED ORDER — ONDANSETRON HCL 4 MG/2ML IJ SOLN: 4.0000 mg | Freq: Four times a day (QID) | INTRAMUSCULAR | Status: DC | PRN

## 2018-03-21 MED ORDER — ACETAMINOPHEN 325 MG PO TABS: 650.0000 mg | ORAL_TABLET | Freq: Three times a day (TID) | ORAL | Status: DC

## 2018-03-21 MED ORDER — MIDAZOLAM HCL 2 MG/2ML IJ SOLN: 0.5000 mg | Freq: Once | INTRAMUSCULAR | Status: DC | PRN

## 2018-03-21 MED ORDER — KCL IN DEXTROSE-NACL 20-5-0.45 MEQ/L-%-% IV SOLN
INTRAVENOUS | Status: DC
  Administered 2018-03-21: 23:00:00 via INTRAVENOUS
  Filled 2018-03-21: qty 1000

## 2018-03-21 MED ORDER — LIDOCAINE 2% (20 MG/ML) 5 ML SYRINGE
INTRAMUSCULAR | Status: AC
  Filled 2018-03-21: qty 5

## 2018-03-21 MED ORDER — MEPERIDINE HCL 50 MG/ML IJ SOLN
INTRAMUSCULAR | Status: AC
  Filled 2018-03-21: qty 1

## 2018-03-21 MED ORDER — MIDAZOLAM HCL 5 MG/5ML IJ SOLN
INTRAMUSCULAR | Status: DC | PRN
  Administered 2018-03-21 (×2): 1 mg via INTRAVENOUS

## 2018-03-21 MED ORDER — ROCURONIUM BROMIDE 50 MG/5ML IV SOSY
PREFILLED_SYRINGE | INTRAVENOUS | Status: DC | PRN
  Administered 2018-03-21: 35 mg via INTRAVENOUS

## 2018-03-21 MED ORDER — FENTANYL CITRATE (PF) 100 MCG/2ML IJ SOLN
INTRAMUSCULAR | Status: DC | PRN
  Administered 2018-03-21: 50 ug via INTRAVENOUS
  Administered 2018-03-21: 100 ug via INTRAVENOUS
  Administered 2018-03-21 (×2): 50 ug via INTRAVENOUS

## 2018-03-21 MED ORDER — LIDOCAINE 2% (20 MG/ML) 5 ML SYRINGE
INTRAMUSCULAR | Status: DC | PRN
  Administered 2018-03-21: 70 mg via INTRAVENOUS

## 2018-03-21 MED ORDER — LACTATED RINGERS IR SOLN
Status: DC | PRN
  Administered 2018-03-21: 1000 mL

## 2018-03-21 MED ORDER — GLYCOPYRROLATE PF 0.2 MG/ML IJ SOSY
PREFILLED_SYRINGE | INTRAMUSCULAR | Status: AC
  Filled 2018-03-21: qty 3

## 2018-03-21 MED ORDER — SUCCINYLCHOLINE CHLORIDE 200 MG/10ML IV SOSY
PREFILLED_SYRINGE | INTRAVENOUS | Status: AC
  Filled 2018-03-21: qty 10

## 2018-03-21 MED ORDER — NEOSTIGMINE METHYLSULFATE 5 MG/5ML IV SOSY
PREFILLED_SYRINGE | INTRAVENOUS | Status: AC
  Filled 2018-03-21: qty 5

## 2018-03-21 MED ORDER — SODIUM CHLORIDE 0.9 % IV SOLN
1.0000 g | Freq: Once | INTRAVENOUS | Status: AC
  Administered 2018-03-21 (×2): 1 g via INTRAVENOUS
  Filled 2018-03-21 (×2): qty 1

## 2018-03-21 MED ORDER — TRAMADOL HCL 50 MG PO TABS: 50.0000 mg | ORAL_TABLET | Freq: Four times a day (QID) | ORAL | Status: DC | PRN

## 2018-03-21 MED ORDER — ROCURONIUM BROMIDE 10 MG/ML (PF) SYRINGE
PREFILLED_SYRINGE | INTRAVENOUS | Status: AC
  Filled 2018-03-21: qty 10

## 2018-03-21 SURGICAL SUPPLY — 27 items
ADH SKN CLS APL DERMABOND .7 (GAUZE/BANDAGES/DRESSINGS)
APL SKNCLS STERI-STRIP NONHPOA (GAUZE/BANDAGES/DRESSINGS)
BENZOIN TINCTURE PRP APPL 2/3 (GAUZE/BANDAGES/DRESSINGS) IMPLANT
CABLE HIGH FREQUENCY MONO STRZ (ELECTRODE) IMPLANT
CHLORAPREP W/TINT 26ML (MISCELLANEOUS) ×3 IMPLANT
CLOSURE WOUND 1/2 X4 (GAUZE/BANDAGES/DRESSINGS)
COVER SURGICAL LIGHT HANDLE (MISCELLANEOUS) ×3 IMPLANT
DECANTER SPIKE VIAL GLASS SM (MISCELLANEOUS) IMPLANT
DERMABOND ADVANCED (GAUZE/BANDAGES/DRESSINGS)
DERMABOND ADVANCED .7 DNX12 (GAUZE/BANDAGES/DRESSINGS) IMPLANT
ELECT REM PT RETURN 15FT ADLT (MISCELLANEOUS) ×3 IMPLANT
GLOVE SURG SIGNA 7.5 PF LTX (GLOVE) ×3 IMPLANT
GOWN STRL REUS W/TWL XL LVL3 (GOWN DISPOSABLE) ×9 IMPLANT
IRRIG SUCT STRYKERFLOW 2 WTIP (MISCELLANEOUS)
IRRIGATION SUCT STRKRFLW 2 WTP (MISCELLANEOUS) IMPLANT
KIT BASIN OR (CUSTOM PROCEDURE TRAY) ×3 IMPLANT
SHEARS HARMONIC ACE PLUS 36CM (ENDOMECHANICALS) IMPLANT
SLEEVE XCEL OPT CAN 5 100 (ENDOMECHANICALS) ×3 IMPLANT
STRIP CLOSURE SKIN 1/2X4 (GAUZE/BANDAGES/DRESSINGS) IMPLANT
SUT MNCRL AB 4-0 PS2 18 (SUTURE) ×3 IMPLANT
TOWEL OR 17X26 10 PK STRL BLUE (TOWEL DISPOSABLE) ×3 IMPLANT
TOWEL OR NON WOVEN STRL DISP B (DISPOSABLE) ×3 IMPLANT
TRAY FOLEY MTR SLVR 16FR STAT (SET/KITS/TRAYS/PACK) IMPLANT
TRAY LAPAROSCOPIC (CUSTOM PROCEDURE TRAY) ×3 IMPLANT
TROCAR BLADELESS OPT 5 100 (ENDOMECHANICALS) ×3 IMPLANT
TROCAR XCEL BLUNT TIP 100MML (ENDOMECHANICALS) IMPLANT
TROCAR XCEL NON-BLD 11X100MML (ENDOMECHANICALS) IMPLANT

## 2018-03-21 NOTE — Anesthesia Procedure Notes (Signed)
Procedure Name: Intubation Date/Time: 03/21/2018 7:08 PM Performed by: Montel Clock, CRNA Pre-anesthesia Checklist: Patient identified, Emergency Drugs available, Suction available, Patient being monitored and Timeout performed Patient Re-evaluated:Patient Re-evaluated prior to induction Oxygen Delivery Method: Circle system utilized Preoxygenation: Pre-oxygenation with 100% oxygen Induction Type: IV induction, Rapid sequence and Cricoid Pressure applied Laryngoscope Size: Mac and 3 Grade View: Grade I Tube type: Oral Tube size: 7.0 mm Number of attempts: 1 Airway Equipment and Method: Stylet Placement Confirmation: ETT inserted through vocal cords under direct vision,  positive ETCO2 and breath sounds checked- equal and bilateral Secured at: 22 cm Tube secured with: Tape Dental Injury: Teeth and Oropharynx as per pre-operative assessment

## 2018-03-21 NOTE — ED Notes (Signed)
Pt transported to pre-surgical area.

## 2018-03-21 NOTE — ED Notes (Signed)
ED TO INPATIENT HANDOFF REPORT  Name/Age/Gender Victoria Romero 37 y.o. female  Code Status Code Status History    Date Active Date Inactive Code Status Order ID Comments User Context   01/07/2018 2246 01/09/2018 0144 Full Code [id number]  Philip Aspen, DO Inpatient   01/07/2018 1529 01/07/2018 2245 Full Code [id number]  Arville Care, RN Inpatient      Home/SNF/Other Home  Chief Complaint right side pain   Level of Care/Admitting Diagnosis ED Disposition    None      Medical History Past Medical History:  Diagnosis Date  . Vaginal Pap smear, abnormal     Allergies No Known Allergies  IV Location/Drains/Wounds Patient Lines/Drains/Airways Status   Active Line/Drains/Airways    Name:   Placement date:   Placement time:   Site:   Days:   Peripheral IV 03/21/18 Right Antecubital   03/21/18    1639    Antecubital   less than 1   Incision (Closed) 01/21/18 Perineum   01/21/18    1410     59          Labs/Imaging Results for orders placed or performed during the hospital encounter of 03/21/18 (from the past 48 hour(s))  Urinalysis, Routine w reflex microscopic     Status: Abnormal   Collection Time: 03/21/18 11:33 AM  Result Value Ref Range   Color, Urine YELLOW YELLOW   APPearance CLEAR CLEAR   Specific Gravity, Urine 1.024 1.005 - 1.030   pH 5.0 5.0 - 8.0   Glucose, UA NEGATIVE NEGATIVE mg/dL   Hgb urine dipstick MODERATE (A) NEGATIVE   Bilirubin Urine NEGATIVE NEGATIVE   Ketones, ur NEGATIVE NEGATIVE mg/dL   Protein, ur NEGATIVE NEGATIVE mg/dL   Nitrite NEGATIVE NEGATIVE   Leukocytes, UA NEGATIVE NEGATIVE   RBC / HPF 21-50 0 - 5 RBC/hpf   WBC, UA 0-5 0 - 5 WBC/hpf   Bacteria, UA NONE SEEN NONE SEEN   Mucus PRESENT     Comment: Performed at [facility], [address], [place], [place] [address]  Pregnancy, urine     Status: None   Collection Time: 03/21/18 11:33 AM  Result Value Ref Range   Preg Test, Ur NEGATIVE NEGATIVE    Comment:         THE SENSITIVITY OF THIS METHODOLOGY IS >20 mIU/mL. Performed at [facility], [address], [place], [place] [address]   I-stat Chem 8, ED     Status: None   Collection Time: 03/21/18  3:02 PM  Result Value Ref Range   Sodium 140 135 - 145 mmol/L   Potassium 3.6 3.5 - 5.1 mmol/L   Chloride 108 98 - 111 mmol/L   BUN 13 6 - 20 mg/dL   Creatinine, Ser 8.65 0.44 - 1.00 mg/dL   Glucose, Bld 83 70 - 99 mg/dL   Calcium, Ion 7.84 6.96 - 1.40 mmol/L   TCO2 22 22 - 32 mmol/L   Hemoglobin 12.2 12.0 - 15.0 g/dL   HCT 29.5 28.4 - 13.2 %  CBC with Differential     Status: None   Collection Time: 03/21/18  4:40 PM  Result Value Ref Range   WBC 7.1 4.0 - 10.5 K/uL   RBC 4.67 3.87 - 5.11 MIL/uL   Hemoglobin 12.9 12.0 - 15.0 g/dL   HCT 44.0 10.2 - 72.5 %   MCV 85.2 78.0 - 100.0 fL   MCH 27.6 26.0 - 34.0 pg   MCHC 32.4  30.0 - 36.0 g/dL   RDW 45.414.6 09.811.5 - 11.915.5 %   Platelets 293 150 - 400 K/uL   Neutrophils Relative % 51 %   Neutro Abs 3.6 1.7 - 7.7 K/uL   Lymphocytes Relative 38 %   Lymphs Abs 2.7 0.7 - 4.0 K/uL   Monocytes Relative 8 %   Monocytes Absolute 0.6 0.1 - 1.0 K/uL   Eosinophils Relative 3 %   Eosinophils Absolute 0.2 0.0 - 0.7 K/uL   Basophils Relative 0 %   Basophils Absolute 0.0 0.0 - 0.1 K/uL    Comment: Performed at [facility], [address] [place], [place] [address]  Type and screen [facility]     Status: None (Preliminary result)   Collection Time: 03/21/18  4:53 PM  Result Value Ref Range   ABO/RH(D) O POS    Antibody Screen PENDING    Sample Expiration      03/24/2018 Performed at [facility], [address] [place], [place] [address]    [place] Transvaginal Non-ob  Result Date: 03/21/2018 CLINICAL DATA:  Right pelvic pain EXAM: TRANSABDOMINAL AND TRANSVAGINAL ULTRASOUND OF PELVIS DOPPLER ULTRASOUND OF OVARIES TECHNIQUE: Both transabdominal and transvaginal ultrasound  examinations of the pelvis were performed. Transabdominal technique was performed for global imaging of the pelvis including uterus, ovaries, adnexal regions, and pelvic cul-de-sac. It was necessary to proceed with endovaginal exam following the transabdominal exam to visualize the endometrium and bilateral ovaries. Color and duplex Doppler ultrasound was utilized to evaluate blood flow to the ovaries. COMPARISON:  None. Pelvic ultrasound dated 01/20/2018 FINDINGS: Uterus Measurements: 7.1 x 4.6 x 6.0 cm. No fibroids or other mass visualized. Endometrium Thickness: 4 mm.  No focal abnormality visualized. Right ovary Measurements: 3.9 x 2.1 x 1.9 cm. Normal appearance/no adnexal mass. Left ovary Measurements: 2.9 x 1.9 x 2.8 cm. Normal appearance/no adnexal mass. Pulsed Doppler evaluation of both ovaries demonstrates normal low-resistance arterial and venous waveforms. Other findings No abnormal free fluid. IMPRESSION: Negative pelvic ultrasound. No evidence of ovarian torsion. Electronically Signed   By: Charline BillsSriyesh  Krishnan M.D.   On: 03/21/2018 14:47   [place] Pelvis Complete  Result Date: 03/21/2018 CLINICAL DATA:  Right pelvic pain EXAM: TRANSABDOMINAL AND TRANSVAGINAL ULTRASOUND OF PELVIS DOPPLER ULTRASOUND OF OVARIES TECHNIQUE: Both transabdominal and transvaginal ultrasound examinations of the pelvis were performed. Transabdominal technique was performed for global imaging of the pelvis including uterus, ovaries, adnexal regions, and pelvic cul-de-sac. It was necessary to proceed with endovaginal exam following the transabdominal exam to visualize the endometrium and bilateral ovaries. Color and duplex Doppler ultrasound was utilized to evaluate blood flow to the ovaries. COMPARISON:  None. Pelvic ultrasound dated 01/20/2018 FINDINGS: Uterus Measurements: 7.1 x 4.6 x 6.0 cm. No fibroids or other mass visualized. Endometrium Thickness: 4 mm.  No focal abnormality visualized. Right ovary Measurements: 3.9 x 2.1 x  1.9 cm. Normal appearance/no adnexal mass. Left ovary Measurements: 2.9 x 1.9 x 2.8 cm. Normal appearance/no adnexal mass. Pulsed Doppler evaluation of both ovaries demonstrates normal low-resistance arterial and venous waveforms. Other findings No abnormal free fluid. IMPRESSION: Negative pelvic ultrasound. No evidence of ovarian torsion. Electronically Signed   By: Charline BillsSriyesh  Krishnan M.D.   On: 03/21/2018 14:47   [place] Renal  Result Date: 03/21/2018 CLINICAL DATA:  Right pelvic pain EXAM: RENAL / URINARY TRACT ULTRASOUND COMPLETE COMPARISON:  08/07/2016 FINDINGS: Right Kidney: Length: 11.0.  No mass or hydronephrosis. Left Kidney: Length: 11.3.  No mass or hydronephrosis. Bladder:  Not discretely visualized/underdistended. IMPRESSION: Negative renal ultrasound.  No hydronephrosis. Electronically Signed   By: Charline Bills M.D.   On: 03/21/2018 14:43   [place] Art/ven Flow Abd Pelv Doppler  Result Date: 03/21/2018 CLINICAL DATA:  Right pelvic pain EXAM: TRANSABDOMINAL AND TRANSVAGINAL ULTRASOUND OF PELVIS DOPPLER ULTRASOUND OF OVARIES TECHNIQUE: Both transabdominal and transvaginal ultrasound examinations of the pelvis were performed. Transabdominal technique was performed for global imaging of the pelvis including uterus, ovaries, adnexal regions, and pelvic cul-de-sac. It was necessary to proceed with endovaginal exam following the transabdominal exam to visualize the endometrium and bilateral ovaries. Color and duplex Doppler ultrasound was utilized to evaluate blood flow to the ovaries. COMPARISON:  None. Pelvic ultrasound dated 01/20/2018 FINDINGS: Uterus Measurements: 7.1 x 4.6 x 6.0 cm. No fibroids or other mass visualized. Endometrium Thickness: 4 mm.  No focal abnormality visualized. Right ovary Measurements: 3.9 x 2.1 x 1.9 cm. Normal appearance/no adnexal mass. Left ovary Measurements: 2.9 x 1.9 x 2.8 cm. Normal appearance/no adnexal mass. Pulsed Doppler evaluation of both ovaries demonstrates normal  low-resistance arterial and venous waveforms. Other findings No abnormal free fluid. IMPRESSION: Negative pelvic ultrasound. No evidence of ovarian torsion. Electronically Signed   By: Charline Bills M.D.   On: 03/21/2018 14:47   Ct Renal Stone Study  Result Date: 03/21/2018 CLINICAL DATA:  RIGHT flank pain since September 12th. Ten weeks post pregnancy. EXAM: CT ABDOMEN AND PELVIS WITHOUT CONTRAST TECHNIQUE: Multidetector CT imaging of the abdomen and pelvis was performed following the standard protocol without IV contrast. COMPARISON:  None. FINDINGS: Lower chest: No acute abnormality. Hepatobiliary: No focal liver abnormality is seen. Status post cholecystectomy. No biliary dilatation. Pancreas: Unremarkable. No pancreatic ductal dilatation or surrounding inflammatory changes. Spleen: Normal in size without focal abnormality. Adrenals/Urinary Tract: Adrenal glands appear normal. Kidneys are unremarkable without mass, stone or hydronephrosis. No perinephric inflammation. No ureteral or bladder calculi identified. Bladder appears normal, partially decompressed. Stomach/Bowel: Tortuous loops of decompressed sigmoid and ascending colon within the RIGHT abdomen (axial series 2, images 41 through 45; sagittal series 5, images 70 through 77; coronal series 4, images 47 through 57), with twisting of the surrounding mesentery. On the sagittal projection, the involved loops of colon extend posteriorly and inferiorly through an apparent omental defect before coursing superiorly again and exiting anteriorly from the presumed omental defect on image 80. No obvious bowel wall inflammation or thickening. No mesenteric fluid. No proximal colonic distension to suggest associated bowel obstruction at this time. Stomach is unremarkable. Small bowel within the LEFT abdomen lies to the LEFT of the colon which again suggests possible omental defect. Vascular/Lymphatic: No significant vascular findings are present. No enlarged  abdominal or pelvic lymph nodes. Reproductive: Uterus and bilateral adnexa are unremarkable. Other: No free fluid or abscess collection. No free intraperitoneal air. Musculoskeletal: No acute or suspicious osseous finding. IMPRESSION: 1. 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 No convincing bowel wall inflammation, mesenteric fluid or other signs of threatened bowel at this time. Clinical symptoms could indicate associated intermittent bowel obstruction and/or bowel wall inflammation at the presumed internal hernia site. 2. 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 3. Remainder of the abdomen and pelvis  CT is unremarkable. No renal or ureteral calculi. No free fluid or abscess collection. No free intraperitoneal air. Electronically Signed   By: Bary Richard M.D.   On: 03/21/2018 16:02    Pending Labs Unresulted Labs (From admission, onward)   None      Vitals/Pain Today's Vitals   03/21/18 1129 03/21/18 1328 03/21/18 1458 03/21/18 1639  BP: (!) 131/91 118/79 105/69 112/83  Pulse: 72 76 66 69  Resp: 16 16 16 16   Temp: 98.2 F (36.8 C) 97.8 F (36.6 C)    TempSrc: Oral Oral    SpO2: 100% 100% 100% 99%  PainSc: 5        Isolation Precautions No active isolations  Medications Medications - No data to display  Mobility walks

## 2018-03-21 NOTE — ED Triage Notes (Signed)
Pt c/o R pelvic pain x 2 days. Pt states pain is sharp and worse at night. Pt states she is 10 weeks post partum and started with vaginal bleeding / possible period on 03/18/2018. Pt with d&c 01/21/2018 and did not have a period after that until 03/18/2018.

## 2018-03-21 NOTE — ED Provider Notes (Signed)
[facility]-EMERGENCY DEPT Provider Note   CSN: [id number] Arrival date & time: 03/21/18  1100     History   Chief Complaint Chief Complaint  Patient presents with  . Pelvic Pain    HPI Victoria Romero is a 37 y.o. female.  Patient is a 37 year old female who presents with right lower pelvic pain that started 2 days ago.  She states is been intermittent and waxing and waning intensity.  Its in her right lower abdomen that radiates to her right lower back.  She also did start her menstrual cycle 2 days ago.  She is 10 weeks postpartum and this is the first bleeding that she has had.  She is on the NuvaRing which she started about 3 to 4 weeks ago.  She denies any associated nausea or vomiting.  No fevers.  No change in bowel habits.  No urinary symptoms.  She had a questionable history of a kidney stone in the past but it was never documented on imaging studies.  She is not breast-feeding.  She did take a dose of Tylenol on Friday which did not help with her symptoms.     Past Medical History:  Diagnosis Date  . Vaginal Pap smear, abnormal     Patient Active Problem List   Diagnosis Date Noted  . Normal labor 01/07/2018    Past Surgical History:  Procedure Laterality Date  . CERVICAL CONE BIOPSY  2003  . CHOLECYSTECTOMY    . DILATION AND EVACUATION N/A 01/21/2018   Procedure: DILATATION AND EVACUATION;  Surgeon: Philip Aspen, DO;  Location: [facility];  Service: Gynecology;  Laterality: N/A;  . LEEP  2002  . TONSILLECTOMY       OB History    Gravida  4   Para  3   Term  2   Preterm  1   AB  1   Living  3     SAB  1   TAB  0   Ectopic  0   Multiple  0   Live Births  3            Home Medications    Prior to Admission medications   Medication Sig Start Date End Date Taking? Authorizing Provider  acetaminophen (TYLENOL) 500 MG tablet Take 1,000 mg by mouth every 6 (six) hours as needed for moderate pain or headache.   Yes  [provider]  cetirizine (ZYRTEC) 10 MG tablet Take 10 mg by mouth daily.    Yes [provider]  etonogestrel-ethinyl estradiol (NUVARING) 0.12-0.015 MG/24HR vaginal ring Place 1 each vaginally every 28 (twenty-eight) days. Insert vaginally and leave in place for 3 consecutive weeks, then remove for 1 week.   Yes [provider]  sertraline (ZOLOFT) 50 MG tablet Take 50 mg by mouth daily. 03/16/18  Yes [provider]  tretinoin (RETIN-A) 0.05 % cream Apply 1 application topically 2 (two) times a week.   Yes [provider]  docusate sodium (COLACE) 100 MG capsule Take 1 capsule (100 mg total) by mouth every 12 (twelve) hours. Patient not taking: Reported on 01/07/2018 06/15/12   Key, Verita Schneiders, NP  oxyCODONE-acetaminophen (PERCOCET/ROXICET) 5-325 MG tablet Take 1-2 tablets by mouth every 6 (six) hours as needed for severe pain. Patient not taking: Reported on 03/21/2018 01/21/18   Philip Aspen, DO    Family History Family History  Problem Relation Age of Onset  . Heart disease Mother     Social History  Social History   Tobacco Use  . Smoking status: Never Smoker  . Smokeless tobacco: Never Used  Substance Use Topics  . Alcohol use: No  . Drug use: No     Allergies   Patient has no known allergies.   Review of Systems Review of Systems  Constitutional: Negative for chills, diaphoresis, fatigue and fever.  HENT: Negative for congestion, rhinorrhea and sneezing.   Eyes: Negative.   Respiratory: Negative for cough, chest tightness and shortness of breath.   Cardiovascular: Negative for chest pain and leg swelling.  Gastrointestinal: Positive for abdominal pain. Negative for blood in stool, diarrhea, nausea and vomiting.  Genitourinary: Positive for pelvic pain and vaginal bleeding. Negative for difficulty urinating, flank pain, frequency, hematuria and vaginal discharge.  Musculoskeletal: Positive for back pain. Negative for  arthralgias.  Skin: Negative for rash.  Neurological: Negative for dizziness, speech difficulty, weakness, numbness and headaches.     Physical Exam Updated Vital Signs BP 105/69   Pulse 66   Temp 97.8 F (36.6 C) (Oral)   Resp 16   LMP 03/18/2018   SpO2 100%   Physical Exam  Constitutional: She is oriented to person, place, and time. She appears well-developed and well-nourished.  HENT:  Head: Normocephalic and atraumatic.  Eyes: Pupils are equal, round, and reactive to light.  Neck: Normal range of motion. Neck supple.  Cardiovascular: Normal rate, regular rhythm and normal heart sounds.  Pulmonary/Chest: Effort normal and breath sounds normal. No respiratory distress. She has no wheezes. She has no rales. She exhibits no tenderness.  Abdominal: Soft. Bowel sounds are normal. There is no tenderness. There is no rebound and no guarding.  Genitourinary:  Genitourinary Comments: Patient has period like bleeding on vaginal exam, no heavy or bright red bleeding.  Positive tenderness in the right adnexa.  No cervical motion tenderness.  Musculoskeletal: Normal range of motion. She exhibits no edema.  Lymphadenopathy:    She has no cervical adenopathy.  Neurological: She is alert and oriented to person, place, and time.  Skin: Skin is warm and dry. No rash noted.  Psychiatric: She has a normal mood and affect.     ED Treatments / Results  Labs (all labs ordered are listed, but only abnormal results are displayed) Labs Reviewed  URINALYSIS, ROUTINE W REFLEX MICROSCOPIC - Abnormal; Notable for the following components:      Result Value   Hgb urine dipstick MODERATE (*)    All other components within normal limits  PREGNANCY, URINE  CBC WITH DIFFERENTIAL/PLATELET  I-STAT CHEM 8, ED    EKG None  Radiology US Transvaginal Non-ob  Result Date: 03/21/2018 CLINICAL DATA:  Right pelvic pain EXAM: TRANSABDOMINAL AND TRANSVAGINAL ULTRASOUND OF PELVIS DOPPLER ULTRASOUND OF  OVARIES TECHNIQUE: Both transabdominal and transvaginal ultrasound examinations of the pelvis were performed. Transabdominal technique was performed for global imaging of the pelvis including uterus, ovaries, adnexal regions, and pelvic cul-de-sac. It was necessary to proceed with endovaginal exam following the transabdominal exam to visualize the endometrium and bilateral ovaries. Color and duplex Doppler ultrasound was utilized to evaluate blood flow to the ovaries. COMPARISON:  None. Pelvic ultrasound dated 01/20/2018 FINDINGS: Uterus Measurements: 7.1 x 4.6 x 6.0 cm. No fibroids or other mass visualized. Endometrium Thickness: 4 mm.  No focal abnormality visualized. Right ovary Measurements: 3.9 x 2.1 x 1.9 cm. Normal appearance/no adnexal mass. Left ovary Measurements: 2.9 x 1.9 x 2.8 cm. Normal appearance/no adnexal mass. Pulsed Doppler evaluation of both ovaries demonstrates normal low-resistance  arterial and venous waveforms. Other findings No abnormal free fluid. IMPRESSION: Negative pelvic ultrasound. No evidence of ovarian torsion. Electronically Signed   By: Charline BillsSriyesh  Krishnan M.D.   On: 03/21/2018 14:47   [place] Pelvis Complete  Result Date: 03/21/2018 CLINICAL DATA:  Right pelvic pain EXAM: TRANSABDOMINAL AND TRANSVAGINAL ULTRASOUND OF PELVIS DOPPLER ULTRASOUND OF OVARIES TECHNIQUE: Both transabdominal and transvaginal ultrasound examinations of the pelvis were performed. Transabdominal technique was performed for global imaging of the pelvis including uterus, ovaries, adnexal regions, and pelvic cul-de-sac. It was necessary to proceed with endovaginal exam following the transabdominal exam to visualize the endometrium and bilateral ovaries. Color and duplex Doppler ultrasound was utilized to evaluate blood flow to the ovaries. COMPARISON:  None. Pelvic ultrasound dated 01/20/2018 FINDINGS: Uterus Measurements: 7.1 x 4.6 x 6.0 cm. No fibroids or other mass visualized. Endometrium Thickness: 4 mm.  No  focal abnormality visualized. Right ovary Measurements: 3.9 x 2.1 x 1.9 cm. Normal appearance/no adnexal mass. Left ovary Measurements: 2.9 x 1.9 x 2.8 cm. Normal appearance/no adnexal mass. Pulsed Doppler evaluation of both ovaries demonstrates normal low-resistance arterial and venous waveforms. Other findings No abnormal free fluid. IMPRESSION: Negative pelvic ultrasound. No evidence of ovarian torsion. Electronically Signed   By: Charline BillsSriyesh  Krishnan M.D.   On: 03/21/2018 14:47   [place] Renal  Result Date: 03/21/2018 CLINICAL DATA:  Right pelvic pain EXAM: RENAL / URINARY TRACT ULTRASOUND COMPLETE COMPARISON:  08/07/2016 FINDINGS: Right Kidney: Length: 11.0.  No mass or hydronephrosis. Left Kidney: Length: 11.3.  No mass or hydronephrosis. Bladder: Not discretely visualized/underdistended. IMPRESSION: Negative renal ultrasound.  No hydronephrosis. Electronically Signed   By: Charline BillsSriyesh  Krishnan M.D.   On: 03/21/2018 14:43   [place] Art/ven Flow Abd Pelv Doppler  Result Date: 03/21/2018 CLINICAL DATA:  Right pelvic pain EXAM: TRANSABDOMINAL AND TRANSVAGINAL ULTRASOUND OF PELVIS DOPPLER ULTRASOUND OF OVARIES TECHNIQUE: Both transabdominal and transvaginal ultrasound examinations of the pelvis were performed. Transabdominal technique was performed for global imaging of the pelvis including uterus, ovaries, adnexal regions, and pelvic cul-de-sac. It was necessary to proceed with endovaginal exam following the transabdominal exam to visualize the endometrium and bilateral ovaries. Color and duplex Doppler ultrasound was utilized to evaluate blood flow to the ovaries. COMPARISON:  None. Pelvic ultrasound dated 01/20/2018 FINDINGS: Uterus Measurements: 7.1 x 4.6 x 6.0 cm. No fibroids or other mass visualized. Endometrium Thickness: 4 mm.  No focal abnormality visualized. Right ovary Measurements: 3.9 x 2.1 x 1.9 cm. Normal appearance/no adnexal mass. Left ovary Measurements: 2.9 x 1.9 x 2.8 cm. Normal appearance/no adnexal  mass. Pulsed Doppler evaluation of both ovaries demonstrates normal low-resistance arterial and venous waveforms. Other findings No abnormal free fluid. IMPRESSION: Negative pelvic ultrasound. No evidence of ovarian torsion. Electronically Signed   By: Charline BillsSriyesh  Krishnan M.D.   On: 03/21/2018 14:47   Ct Renal Stone Study  Result Date: 03/21/2018 CLINICAL DATA:  RIGHT flank pain since September 12th. Ten weeks post pregnancy. EXAM: CT ABDOMEN AND PELVIS WITHOUT CONTRAST TECHNIQUE: Multidetector CT imaging of the abdomen and pelvis was performed following the standard protocol without IV contrast. COMPARISON:  None. FINDINGS: Lower chest: No acute abnormality. Hepatobiliary: No focal liver abnormality is seen. Status post cholecystectomy. No biliary dilatation. Pancreas: Unremarkable. No pancreatic ductal dilatation or surrounding inflammatory changes. Spleen: Normal in size without focal abnormality. Adrenals/Urinary Tract: Adrenal glands appear normal. Kidneys are unremarkable without mass, stone or hydronephrosis. No perinephric inflammation. No ureteral or bladder calculi identified. Bladder appears normal, partially decompressed. Stomach/Bowel: Tortuous loops  of decompressed sigmoid and ascending colon within the RIGHT abdomen (axial series 2, images 41 through 45; sagittal series 5, images 70 through 77; coronal series 4, images 47 through 57), with twisting of the surrounding mesentery. On the sagittal projection, the involved loops of colon extend posteriorly and inferiorly through an apparent omental defect before coursing superiorly again and exiting anteriorly from the presumed omental defect on image 80. No obvious bowel wall inflammation or thickening. No mesenteric fluid. No proximal colonic distension to suggest associated bowel obstruction at this time. Stomach is unremarkable. Small bowel within the LEFT abdomen lies to the LEFT of the colon which again suggests possible omental defect.  Vascular/Lymphatic: No significant vascular findings are present. No enlarged abdominal or pelvic lymph nodes. Reproductive: Uterus and bilateral adnexa are unremarkable. Other: No free fluid or abscess collection. No free intraperitoneal air. Musculoskeletal: No acute or suspicious osseous finding. IMPRESSION: 1. 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 No convincing bowel wall inflammation, mesenteric fluid or other signs of threatened bowel at this time. Clinical symptoms could indicate associated intermittent bowel obstruction and/or bowel wall inflammation at the presumed internal hernia site. 2. 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 3. Remainder of the abdomen and pelvis CT is unremarkable. No renal or ureteral calculi. No free fluid or abscess collection. No free intraperitoneal air. Electronically Signed   By: Bary Richard M.D.   On: 03/21/2018 16:02    Procedures Procedures (including critical care time)  Medications Ordered in ED Medications - No data to display   Initial Impression / Assessment and Plan / ED Course  I have reviewed the triage vital signs and the nursing notes.  Pertinent labs & imaging results that were available during my care of the patient were reviewed by me and considered in my medical decision making (see chart for details).     PT presents with R lower abd pain/pelvic pain.  US unremarkable.  CT shows an omental defect with decompressed bowel and twisting of the mesentery consistent with an internal hernia.  Labs reviewed.  I have consulted Dr. Ezzard Standing with surgery who  will come see the pt.  Turned over to Dr. Ranae Palms pending disposition by surgery.  Final Clinical Impressions(s) / ED Diagnoses   Final diagnoses:  Internal hernia    ED Discharge Orders    None       Rolan Bucco, MD 03/21/18 1630

## 2018-03-21 NOTE — Anesthesia Preprocedure Evaluation (Signed)
Anesthesia Evaluation  Patient identified by MRN, date of birth, ID band Patient awake    Reviewed: Allergy & Precautions, NPO status , Patient's Chart, lab work & pertinent test results  History of Anesthesia Complications Negative for: history of anesthetic complications  Airway Mallampati: I  TM Distance: >3 FB Neck ROM: Full    Dental  (+) Dental Advisory Given, Teeth Intact   Pulmonary neg pulmonary ROS,    breath sounds clear to auscultation       Cardiovascular negative cardio ROS   Rhythm:Regular Rate:Normal     Neuro/Psych Anxiety negative neurological ROS     GI/Hepatic Neg liver ROS, Abdominal pain   Endo/Other  negative endocrine ROS  Renal/GU negative Renal ROS     Musculoskeletal   Abdominal   Peds  Hematology negative hematology ROS (+)   Anesthesia Other Findings   Reproductive/Obstetrics 10 weeks post partum: having period now Not nursing                             Anesthesia Physical Anesthesia Plan  ASA: II  Anesthesia Plan: General   Post-op Pain Management:    Induction: Intravenous and Rapid sequence  PONV Risk Score and Plan: 4 or greater and Ondansetron, Dexamethasone and Scopolamine patch - Pre-op  Airway Management Planned: Oral ETT  Additional Equipment:   Intra-op Plan:   Post-operative Plan: Extubation in OR  Informed Consent: I have reviewed the patients History and Physical, chart, labs and discussed the procedure including the risks, benefits and alternatives for the proposed anesthesia with the patient or authorized representative who has indicated his/her understanding and acceptance.   Dental advisory given  Plan Discussed with: CRNA and Surgeon  Anesthesia Plan Comments: (Plan routine monitors, GETA)        Anesthesia Quick Evaluation

## 2018-03-21 NOTE — H&P (Signed)
Re:   Victoria Romero S Illes DOB:   Sep 22, 1980 MRN:   [id number]  Chief Complaint Abdominal/pelvic pain  ASSESEMENT AND PLAN: 1.  Internal hernia involving sigmoid/ascending colon  Odd presentation.  I discussed findings with the patient and her husband.  I told her that this is an unusual presentation with a diagnosis made primarily by CT scan.  I discussed the options of continued observation versus surgery.  She was to go ahead with exploration tonight.  I discussed doing this laparoscopically, possible open surgery, possible bowel resection, and possible colostomy.  Her postop course and length of hospitalization will depend on surgical findings.   Chief Complaint  Patient presents with  . Pelvic Pain   PHYSICIAN REQUESTING CONSULTATION:  Dr. Ardeen JourdainM. Belfi, [facility]  HISTORY OF PRESENT ILLNESS: Victoria Romero S Force is a 37 y.o. (DOB: Sep 22, 1980)  white female whose primary care physician is Victoria Romero, Victoria, Cordelia Romero and [facility].  Her GYN is Dr. Delray AltS. Callahan  Husband [place] at bedside.   She has had some vague abdominal pain on and off over several months.  At one time, the pain is on the left side of her abdomen.  One time they thought she had kidney stones, because of blood in her urine, but she was pregnant, which limited the evaluation.  She had her 3rd child, daughter Victoria Cornersvy, on 01/07/2018.  She required a D&C on 01/21/2018 for retained products by Dr. Philip AspenSidney Callahan.  She also did start her menstrual cycle 2 days ago.    This time she presented with right-sided abdominal pain which began on Thursday night, September 12.  It lasted about 1 hour.  Then got better.  She had the pain again last night.  This brought her to the emergency room today.  She sometimes has nausea.  She often has an urgency to have a bowel movement.  Her only abdominal surgery was a laparoscopic cholecystectomy 2001.  She has had no prior endoscopy or colonoscopy.  She has no known stomach, liver, or  colon disease.     CTscan - 03/21/2018 - 1. 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 No convincing bowel wall inflammation, mesenteric fluid or other signs of threatened bowel at this time. Clinical symptoms could indicate associated intermittent bowel obstruction and/or bowel wall inflammation at the presumed internal hernia site. 2. 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 [I gave the family copies of the CT scan]   Past Medical History:  Diagnosis Date  . Vaginal Pap smear, abnormal       Past Surgical History:  Procedure Laterality Date  . CERVICAL CONE BIOPSY  2003  . CHOLECYSTECTOMY    . DILATION AND EVACUATION N/A 01/21/2018   Procedure: DILATATION AND EVACUATION;  Surgeon: Philip Aspenallahan, Sidney, DO;  Location: [facility];  Service: Gynecology;  Laterality: N/A;  . LEEP  2002  . TONSILLECTOMY        No current facility-administered medications for this encounter.    Current Outpatient Medications  Medication Sig Dispense Refill  . acetaminophen (TYLENOL) 500 MG tablet Take 1,000 mg by mouth every 6 (six) hours as needed for moderate pain or headache.    .Marland Kitchen  cetirizine (ZYRTEC) 10 MG tablet Take 10 mg by mouth daily.     Marland Kitchen etonogestrel-ethinyl estradiol (NUVARING) 0.12-0.015 MG/24HR vaginal ring Place 1 each vaginally every 28 (twenty-eight) days. Insert vaginally and leave in place for 3 consecutive weeks, then remove for 1 week.    . sertraline (ZOLOFT) 50 MG tablet Take 50 mg by mouth daily.    Marland Kitchen tretinoin (RETIN-A) 0.05 % cream Apply 1 application topically 2 (two) times a week.    . docusate  sodium (COLACE) 100 MG capsule Take 1 capsule (100 mg total) by mouth every 12 (twelve) hours. (Patient not taking: Reported on 01/07/2018) 60 capsule 0  . oxyCODONE-acetaminophen (PERCOCET/ROXICET) 5-325 MG tablet Take 1-2 tablets by mouth every 6 (six) hours as needed for severe pain. (Patient not taking: Reported on 03/21/2018) 10 tablet 0     No Known Allergies  REVIEW OF SYSTEMS: Skin:  No history of rash.  No history of abnormal moles. Infection:  No history of hepatitis or HIV.  No history of MRSA. Neurologic:  No history of stroke.  No history of seizure.  No history of headaches. Cardiac:  No history of hypertension. No history of heart disease.  No history of prior cardiac catheterization.   Pulmonary:  Does not smoke cigarettes.  No asthma or bronchitis.  No OSA/CPAP.  Endocrine:  No diabetes. No thyroid disease. Gastrointestinal:  See HPI Urologic:  No history of bladder infections.  She has had blood in her urine, but never diagnosed with kidney stones. Musculoskeletal:  No history of joint or back disease. Hematologic:  No bleeding disorder.  No history of anemia.  Not anticoagulated. Psycho-social:  The patient is oriented.   The patient has no obvious psychologic or social impairment to understanding our conversation and plan.  SOCIAL and FAMILY HISTORY: Married.  Husband Victoria Romero at bedside. She has 3 children - 35 yo son, 54 yo daughter, and new born Victoria Romero. 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  No family history of bowel abnormalities.  PHYSICAL EXAM: BP 112/83   Pulse 69   Temp 97.8 F (36.6 C) (Oral)   Resp 16   LMP 03/18/2018   SpO2 99%   General: WN WF who is alert and generally healthy appearing.  Skin:  Inspection and palpation - no mass or rash. Eyes:  Conjunctiva and lids unremarkable.            Pupils are equal Ears, Nose, Mouth, and Throat:  Ears and nose unremarkable            Lips and teeth are unremarable. Neck: Supple. No  mass, trachea midline.  No thyroid mass. Lymph Nodes:  No supraclavicular, cervical, or inguinal nodes. Lungs: Normal respiratory effort.  Clear to auscultation and symmetric breath sounds. Heart:  Palpation of the heart is normal.            Auscultation: RRR. No murmur or rub.  Abdomen: Soft. No mass. No hernia.             Normal bowel sounds.  She is sore in her upper abdomen. Rectal: Not done. Musculoskeletal:  Good muscle strength and ROM  in upper and lower extremities.  Neurologic:  Grossly intact to motor and sensory function. Psychiatric: Normal judgement and insight. Behavior is normal.            Oriented to time, person, place.   DATA REVIEWED, COUNSELING AND COORDINATION OF CARE: Epic notes reviewed. Counseling and coordination of  care exceeded more than 50% of the time spent with patient. Total time spent with patient and charting: 50 minutes  Ovidio Kin, MD,  [facility], [place] [address]  [address]   [place], [place]    [address] Phone:  [phone] FAX:  [phone]

## 2018-03-21 NOTE — Anesthesia Postprocedure Evaluation (Signed)
Anesthesia Post Note  Patient: Victoria Romero S Bloch  Procedure(s) Performed: LAPAROSCOPY DIAGNOSTIC (N/A )     Patient location during evaluation: PACU Anesthesia Type: General Level of consciousness: sedated, oriented and patient cooperative Pain management: pain level controlled Vital Signs Assessment: post-procedure vital signs reviewed and stable Respiratory status: spontaneous breathing, nonlabored ventilation, respiratory function stable and patient connected to nasal cannula oxygen Cardiovascular status: blood pressure returned to baseline and stable Postop Assessment: no apparent nausea or vomiting Anesthetic complications: no    Last Vitals:  Vitals:   03/21/18 2037 03/21/18 2125  BP: 126/72   Pulse: 71   Resp:    Temp: 36.9 C 36.7 C  SpO2: 100%     Last Pain:  Vitals:   03/21/18 2125  TempSrc:   PainSc: Asleep                 Shree Espey,E. Cherryl Babin

## 2018-03-21 NOTE — Op Note (Addendum)
03/21/2018  8:27 PM  PATIENT:  Victoria Romero, 37 y.o., female, MRN: [id number]  PREOP DIAGNOSIS:  internal hernia at right transverse colon mesentaery  POSTOP DIAGNOSIS:   Redundant mobile cecum, transverse colon and sigmoid colon with elongated mesentery, no malrotation, no internal hernia  PROCEDURE:   Procedure(s): LAPAROSCOPY DIAGNOSTIC (4 port)  SURGEON:   Ovidio Kin, M.D.  ASSISTANT:   None  ANESTHESIA:   general  Anesthesiologist: Jairo Ben, MD CRNA: Epimenio Sarin, CRNA  General  EBL:  minimal  ml  BLOOD ADMINISTERED: none  DRAINS: none   LOCAL MEDICATIONS USED:   30 cc 1/4% marcaine  SPECIMEN:   none  COUNTS CORRECT:  YES  INDICATIONS FOR PROCEDURE:  Victoria Romero is a 37 y.o. (DOB: 09/04/1980) white female whose primary care physician is Marrian Salvage, PA-C and comes for laparoscopic exploration of possible internal hernia at right transverse colon mesentaery   The indications and risks of the surgery were explained to the patient.  The risks include, but are not limited to, infection, bleeding, and nerve injury.  PROCEDURE: The patient was taken to room #1 at [facility].  She underwent a general anesthesia.  Her left arm was tucked.  Her abdomen is prepped with ChloraPrep and sterilely draped.  She was given 2 g of cefotetan prior to the incision.  A timeout was held the surgical checklist run.  I have access to her abdominal cavity through the left upper quadrant 5 mm Ethicon Optiview trocar.  I placed 3 additional trochars: A 5 mm trocar in the right lower quadrant, a 5 mm trocar in the lower midline, and a 5 mm trocar in the left lower quadrant.  I carried out an abdominal expiration.  On initial evaluation, there appeared to be a twist of her sigmoid colon in the right upper quadrant.  I was able to reduce this without difficulty.  I then identified the cecum and appendix, which was very mobile and on a long mesentery, and ran the  right colon, to the transverse colon, the left colon, to a very redundant sigmoid colon with a long mesentery.  There was no evidence of malrotation.  I then ran the small bowel from ligament of Treitz to the terminal ileum.  There was some fluid in the distal small bowel consistent with a partial obstruction, however the bowel was not overly distended.  I visualized the gallbladder bed in the right upper quadrant that has scar tissue.  I was able to examine the mesentery of the transverse colon and saw no hernia defect.  Dr. Derrell Lolling came over from [facility] and we discussed the case and he watched me run the bowel.  He did not scrub in.  I ran the small bowel 3 times.  I ran the colon 3 times.  I found no clear mesenteric defect or cause for her symptoms.  Her small bowel and part of her colon (cecum, transverse colon, sigmoid colon), however, are on a very long mesentery and very mobile.  The liver and stomach are unremarkable.  Her uterus is somewhat enlarged was unremarkable.  Her ovaries are unremarkable.  The trochars were then removed.  I used 30 cc of quarter percent Marcaine to inject at the trocar sites.  The skin was closed with a 4 Monocryl suture.  The skin pain with Dermabond.  Her sponge needle count were correct in the case.  She was transferred to recovery in good condition.  Ovidio Kin, MD, FACS  [facility] Pager: [phone] Office phone:  [phone]

## 2018-03-21 NOTE — Transfer of Care (Signed)
Immediate Anesthesia Transfer of Care Note  Patient: Victoria Romero  Procedure(s) Performed: LAPAROSCOPY DIAGNOSTIC (N/A )  Patient Location: PACU  Anesthesia Type:General  Level of Consciousness: drowsy and patient cooperative  Airway & Oxygen Therapy: Patient Spontanous Breathing and Patient connected to face mask oxygen  Post-op Assessment: Report given to RN and Post -op Vital signs reviewed and stable  Post vital signs: Reviewed and stable  Last Vitals:  Vitals Value Taken Time  BP    Temp    Pulse    Resp    SpO2      Last Pain:  Vitals:   03/21/18 1328  TempSrc: Oral  PainSc:          Complications: No apparent anesthesia complications

## 2018-03-22 ENCOUNTER — Encounter (HOSPITAL_COMMUNITY): Payer: Self-pay | Admitting: Surgery

## 2018-03-22 ENCOUNTER — Encounter: Payer: Self-pay | Admitting: Gastroenterology

## 2018-03-22 MED ORDER — HYDROCODONE-ACETAMINOPHEN 5-325 MG PO TABS: 1.0000 | ORAL_TABLET | ORAL | 0 refills | Status: DC | PRN

## 2018-03-22 MED ORDER — ACETAMINOPHEN 325 MG PO TABS: 650.0000 mg | ORAL_TABLET | Freq: Three times a day (TID) | ORAL | Status: DC

## 2018-03-22 NOTE — Discharge Summary (Signed)
[facility] Discharge Summary   Patient ID: Victoria Romero MRN: [id number] DOB/AGE: [birth year] 37 y.o.  Admit date: 03/21/2018 Discharge date: 03/22/2018  Discharge Diagnosis Patient Active Problem List   Diagnosis Date Noted  . Colon obstruction (HCC) 03/21/2018  . Normal labor 01/07/2018    Consultants None   Imaging: US Transvaginal Non-ob  Result Date: 03/21/2018 CLINICAL DATA:  Right pelvic pain EXAM: TRANSABDOMINAL AND TRANSVAGINAL ULTRASOUND OF PELVIS DOPPLER ULTRASOUND OF OVARIES TECHNIQUE: Both transabdominal and transvaginal ultrasound examinations of the pelvis were performed. Transabdominal technique was performed for global imaging of the pelvis including uterus, ovaries, adnexal regions, and pelvic cul-de-sac. It was necessary to proceed with endovaginal exam following the transabdominal exam to visualize the endometrium and bilateral ovaries. Color and duplex Doppler ultrasound was utilized to evaluate blood flow to the ovaries. COMPARISON:  None. Pelvic ultrasound dated 01/20/2018 FINDINGS: Uterus Measurements: 7.1 x 4.6 x 6.0 cm. No fibroids or other mass visualized. Endometrium Thickness: 4 mm.  No focal abnormality visualized. Right ovary Measurements: 3.9 x 2.1 x 1.9 cm. Normal appearance/no adnexal mass. Left ovary Measurements: 2.9 x 1.9 x 2.8 cm. Normal appearance/no adnexal mass. Pulsed Doppler evaluation of both ovaries demonstrates normal low-resistance arterial and venous waveforms. Other findings No abnormal free fluid. IMPRESSION: Negative pelvic ultrasound. No evidence of ovarian torsion. Electronically Signed   By: Charline Bills M.D.   On: 03/21/2018 14:47   US Pelvis Complete  Result Date: 03/21/2018 CLINICAL DATA:  Right pelvic pain EXAM: TRANSABDOMINAL AND TRANSVAGINAL ULTRASOUND OF PELVIS DOPPLER ULTRASOUND OF OVARIES TECHNIQUE: Both transabdominal and transvaginal ultrasound examinations of the pelvis were performed. Transabdominal  technique was performed for global imaging of the pelvis including uterus, ovaries, adnexal regions, and pelvic cul-de-sac. It was necessary to proceed with endovaginal exam following the transabdominal exam to visualize the endometrium and bilateral ovaries. Color and duplex Doppler ultrasound was utilized to evaluate blood flow to the ovaries. COMPARISON:  None. Pelvic ultrasound dated 01/20/2018 FINDINGS: Uterus Measurements: 7.1 x 4.6 x 6.0 cm. No fibroids or other mass visualized. Endometrium Thickness: 4 mm.  No focal abnormality visualized. Right ovary Measurements: 3.9 x 2.1 x 1.9 cm. Normal appearance/no adnexal mass. Left ovary Measurements: 2.9 x 1.9 x 2.8 cm. Normal appearance/no adnexal mass. Pulsed Doppler evaluation of both ovaries demonstrates normal low-resistance arterial and venous waveforms. Other findings No abnormal free fluid. IMPRESSION: Negative pelvic ultrasound. No evidence of ovarian torsion. Electronically Signed   By: Charline Bills M.D.   On: 03/21/2018 14:47   US Renal  Result Date: 03/21/2018 CLINICAL DATA:  Right pelvic pain EXAM: RENAL / URINARY TRACT ULTRASOUND COMPLETE COMPARISON:  08/07/2016 FINDINGS: Right Kidney: Length: 11.0.  No mass or hydronephrosis. Left Kidney: Length: 11.3.  No mass or hydronephrosis. Bladder: Not discretely visualized/underdistended. IMPRESSION: Negative renal ultrasound.  No hydronephrosis. Electronically Signed   By: Charline Bills M.D.   On: 03/21/2018 14:43   [place] Art/ven Flow Abd Pelv Doppler  Result Date: 03/21/2018 CLINICAL DATA:  Right pelvic pain EXAM: TRANSABDOMINAL AND TRANSVAGINAL ULTRASOUND OF PELVIS DOPPLER ULTRASOUND OF OVARIES TECHNIQUE: Both transabdominal and transvaginal ultrasound examinations of the pelvis were performed. Transabdominal technique was performed for global imaging of the pelvis including uterus, ovaries, adnexal regions, and pelvic cul-de-sac. It was necessary to proceed with endovaginal exam following  the transabdominal exam to visualize the endometrium and bilateral ovaries. Color and duplex Doppler ultrasound was utilized to evaluate blood flow to the ovaries. COMPARISON:  None. Pelvic ultrasound dated 01/20/2018 FINDINGS:  Uterus Measurements: 7.1 x 4.6 x 6.0 cm. No fibroids or other mass visualized. Endometrium Thickness: 4 mm.  No focal abnormality visualized. Right ovary Measurements: 3.9 x 2.1 x 1.9 cm. Normal appearance/no adnexal mass. Left ovary Measurements: 2.9 x 1.9 x 2.8 cm. Normal appearance/no adnexal mass. Pulsed Doppler evaluation of both ovaries demonstrates normal low-resistance arterial and venous waveforms. Other findings No abnormal free fluid. IMPRESSION: Negative pelvic ultrasound. No evidence of ovarian torsion. Electronically Signed   By: Charline Bills M.D.   On: 03/21/2018 14:47   Ct Renal Stone Study  Result Date: 03/21/2018 CLINICAL DATA:  RIGHT flank pain since September 12th. Ten weeks post pregnancy. EXAM: CT ABDOMEN AND PELVIS WITHOUT CONTRAST TECHNIQUE: Multidetector CT imaging of the abdomen and pelvis was performed following the standard protocol without IV contrast. COMPARISON:  None. FINDINGS: Lower chest: No acute abnormality. Hepatobiliary: No focal liver abnormality is seen. Status post cholecystectomy. No biliary dilatation. Pancreas: Unremarkable. No pancreatic ductal dilatation or surrounding inflammatory changes. Spleen: Normal in size without focal abnormality. Adrenals/Urinary Tract: Adrenal glands appear normal. Kidneys are unremarkable without mass, stone or hydronephrosis. No perinephric inflammation. No ureteral or bladder calculi identified. Bladder appears normal, partially decompressed. Stomach/Bowel: Tortuous loops of decompressed sigmoid and ascending colon within the RIGHT abdomen (axial series 2, images 41 through 45; sagittal series 5, images 70 through 77; coronal series 4, images 47 through 57), with twisting of the surrounding mesentery. On the  sagittal projection, the involved loops of colon extend posteriorly and inferiorly through an apparent omental defect before coursing superiorly again and exiting anteriorly from the presumed omental defect on image 80. No obvious bowel wall inflammation or thickening. No mesenteric fluid. No proximal colonic distension to suggest associated bowel obstruction at this time. Stomach is unremarkable. Small bowel within the LEFT abdomen lies to the LEFT of the colon which again suggests possible omental defect. Vascular/Lymphatic: No significant vascular findings are present. No enlarged abdominal or pelvic lymph nodes. Reproductive: Uterus and bilateral adnexa are unremarkable. Other: No free fluid or abscess collection. No free intraperitoneal air. Musculoskeletal: No acute or suspicious osseous finding. IMPRESSION: 1. 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 No convincing bowel wall inflammation, mesenteric fluid or other signs of threatened bowel at this time. Clinical symptoms could indicate associated intermittent bowel obstruction and/or bowel wall inflammation at the presumed internal hernia site. 2. 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 3. Remainder of the abdomen and pelvis CT is unremarkable. No renal or ureteral calculi. No free fluid or abscess collection. No free intraperitoneal air. Electronically Signed   By: Bary Richard M.D.   On: 03/21/2018 16:02    Procedures Dr. Ovidio Kin (03/21/18) - diagnostic laparoscopy   Hospital Course:  37 y/o female, 10 weeks postpartum, who presented to [facility] with a cc  abdominal pain. She has had some vague abdominal pain on and off over several months. She describes the pain as acute, severe pain, that last hours and is associated with nausea, vomiting, and diarrhea. CT performed in the ED (above) suggested possible internal hernia involving the sigmoid/ascending colon and the patient was taken for the above procedure. During the operation no internal hernia  or obvious obstruction was identified but the patient was noted to have long colonic mesentery. She tolerated the procedure well and was transferred to the med-surg floor. On POD#1 the patients vitals were stable, tolerating PO, mobilizing, and medically stable for discharge home. She was advised to follow up in our [facility] office and with a GI physician. She knows to call with questions or concerns.    Physical Exam: General:  Alert, NAD, pleasant, comfortable Abd:  Soft, ND, appropriately tender, incisions C/D/I, +BS  Allergies as of 03/22/2018   No Known Allergies     Medication List    STOP taking these medications   docusate sodium 100 MG capsule Commonly known as:  COLACE   oxyCODONE-acetaminophen 5-325 MG tablet Commonly known as:  PERCOCET/ROXICET     TAKE these medications   acetaminophen 325 MG tablet Commonly known as:  TYLENOL Take 2 tablets (650 mg total) by mouth every 8 (eight) hours. What changed:    medication strength  how much to take  when to take this  reasons to take this   cetirizine 10 MG tablet Commonly known as:  ZYRTEC Take 10 mg by mouth daily.   etonogestrel-ethinyl estradiol 0.12-0.015 MG/24HR vaginal ring Commonly known as:  NUVARING Place 1 each vaginally every 28 (twenty-eight) days. Insert vaginally and leave in place for 3 consecutive weeks, then remove for 1 week.   HYDROcodone-acetaminophen 5-325 MG tablet Commonly known as:  NORCO/VICODIN Take 1-2 tablets by mouth every 4 (four) hours as needed for moderate pain.   sertraline 50 MG tablet Commonly  known as:  ZOLOFT Take 50 mg by mouth daily.   tretinoin 0.05 % cream Commonly known as:  RETIN-A Apply 1 application topically 2 (two) times a week.        Follow-up Information    Ovidio KinNewman, David, MD. Schedule an appointment as soon as possible for a visit.   Specialty:  General Surgery Why:  2-4 weeks for post-operative follow up.  Contact information: [address] [place] [id number] [phone]           Signed: Hosie SpangleElizabeth Callie Romero, [facility] 03/22/2018, 12:08 PM

## 2018-03-22 NOTE — Discharge Instructions (Signed)

## 2018-03-22 NOTE — Progress Notes (Signed)
Discharge instructions given to pt and all questions were answered. Pt taken down via wheelchair and was picked up by her husband.  

## 2018-04-01 ENCOUNTER — Encounter: Payer: Self-pay | Admitting: Gastroenterology

## 2018-04-01 ENCOUNTER — Ambulatory Visit (INDEPENDENT_AMBULATORY_CARE_PROVIDER_SITE_OTHER): Payer: 59 | Admitting: Gastroenterology

## 2018-04-01 VITALS — BP 128/78 | HR 79 | Ht 68.0 in | Wt 176.1 lb

## 2018-04-01 DIAGNOSIS — R9389 Abnormal findings on diagnostic imaging of other specified body structures: Secondary | ICD-10-CM

## 2018-04-01 DIAGNOSIS — R109 Unspecified abdominal pain: Secondary | ICD-10-CM | POA: Diagnosis not present

## 2018-04-01 DIAGNOSIS — R197 Diarrhea, unspecified: Secondary | ICD-10-CM

## 2018-04-01 DIAGNOSIS — R112 Nausea with vomiting, unspecified: Secondary | ICD-10-CM

## 2018-04-01 MED ORDER — DICYCLOMINE HCL 10 MG PO CAPS: 10.0000 mg | ORAL_CAPSULE | Freq: Three times a day (TID) | ORAL | 3 refills | Status: AC | PRN

## 2018-04-01 MED ORDER — SOD PICOSULFATE-MAG OX-CIT ACD 10-3.5-12 MG-GM -GM/160ML PO SOLN: 1.0000 | ORAL | 0 refills | Status: DC

## 2018-04-01 NOTE — Patient Instructions (Addendum)
If you are age 36 or older, your body mass index should be between 23-30. Your Body mass index is 26.78 kg/m. If this is out of the aforementioned range listed, please consider follow up with your Primary Care Provider.  If you are age 29 or younger, your body mass index should be between 19-25. Your Body mass index is 26.78 kg/m. If this is out of the aformentioned range listed, please consider follow up with your Primary Care Provider.   You have been scheduled for an endoscopy and colonoscopy. Please follow the written instructions given to you at your visit today. Please pick up your prep supplies at the pharmacy within the next 1-3 days. If you use inhalers (even only as needed), please bring them with you on the day of your procedure. Your physician has requested that you go to www.startemmi.com and enter the access code given to you at your visit today. This web site gives a general overview about your procedure. However, you should still follow specific instructions given to you by our office regarding your preparation for the procedure.  We have sent the following medications to your pharmacy for you to pick up at your convenience: Bentyl 10mg  by mouth every 8 hours as needed. Clenpiq  It was a pleasure to see you today!  Vito Cirigliano, D.O.

## 2018-04-01 NOTE — Progress Notes (Signed)
Chief Complaint: Abdominal pain   Referring Provider:     Marrian Salvage, PA-C    HPI:     Victoria Romero is a 37 y.o. female referred to the Gastroenterology Clinic for evaluation of abdominal pain.   She states she has had a long hx of intermittent b/l abdominal pain which has been ongoing for multiple years. Sxs resolved with pregnancy of 2nd child (child now 5). Sxs recurred fall 2018 and with associated n/v and diarrhea. No overt GIB. Had [place] for w/u, no etiology noted but found to be pregnant. Sxs occurred thorughout 1st and early 2nd trimester of 3rd pregnancy, then resolved. Baby born July and sxs have since started to recur.   SxsWorsening pain this month, went to [facility] ER on 03/21/18 endorsing right sided abdominal pain with nausea, diarrhea, and urgency. She was admitted for diagnostic laparoscopy for radiopgraphcic concern for intenal hernia. No hernia noted on lap, but op report notes "long colonic mesentery". She was d/c-ed to home on 9/16. She was referred to the GI clinic for ongoing evaluation of her abdominal pain. She did have the same type of pain recur earlier this morning, but not present currently.    MGM CRC. Mother with IBS, diverticulitis, but no CRC.   She had a ccy 2001 for gallstones.   Reports she as previously seen by GI (unsure where) and trialed some ?laxative agent without improvement. No EGD or colonoscopy.   Recent evaluation notable for Normal CBC.  Aside from above, recent CT with normal appearing liver, status post cholecystectomy, normal pancreas, normal spleen.  Past Medical History:  Diagnosis Date  . Gallstone   . Kidney stone   . UTI (urinary tract infection)   . Vaginal Pap smear, abnormal      Past Surgical History:  Procedure Laterality Date  . CERVICAL CONE BIOPSY  2003  . CHOLECYSTECTOMY    . DILATION AND EVACUATION N/A 01/21/2018   Procedure: DILATATION AND EVACUATION;  Surgeon: Philip Aspen, DO;  Location: [facility]  ORS;  Service: Gynecology;  Laterality: N/A;  . LAPAROSCOPY N/A 03/21/2018   Procedure: LAPAROSCOPY DIAGNOSTIC;  Surgeon: Ovidio Kin, MD;  Location: [facility] ORS;  Service: General;  Laterality: N/A;  . LEEP  2002  . TONSILLECTOMY     Family History  Problem Relation Age of Onset  . Colon polyps Mother   . Irritable bowel syndrome Mother   . Heart disease Maternal Grandmother   . Heart disease Maternal Grandfather    Social History   Tobacco Use  . Smoking status: Never Smoker  . Smokeless tobacco: Never Used  Substance Use Topics  . Alcohol use: No  . Drug use: No   Current Outpatient Medications  Medication Sig Dispense Refill  . cetirizine (ZYRTEC) 10 MG tablet Take 10 mg by mouth daily.     Marland Kitchen etonogestrel-ethinyl estradiol (NUVARING) 0.12-0.015 MG/24HR vaginal ring Place 1 each vaginally every 28 (twenty-eight) days. Insert vaginally and leave in place for 3 consecutive weeks, then remove for 1 week.    . tretinoin (RETIN-A) 0.05 % cream Apply 1 application topically 2 (two) times a week.     No current facility-administered medications for this visit.    No Known Allergies   Review of Systems: All systems reviewed and negative except where noted in HPI.     Physical Exam:    Wt Readings from Last 3 Encounters:  04/01/18 176 lb  2 oz (79.9 kg)  03/21/18 173 lb (78.5 kg)  01/21/18 176 lb (79.8 kg)    Ht 5\' 8"  (1.727 m)   Wt 176 lb 2 oz (79.9 kg)   LMP 03/18/2018   BMI 26.78 kg/m  Constitutional:  Pleasant, in no acute distress. Psychiatric: Normal mood and affect. Behavior is normal. EENT: Pupils normal.  Conjunctivae are normal. No scleral icterus. Neck supple. No cervical LAD. Cardiovascular: Normal rate, regular rhythm. No edema Pulmonary/chest: Effort normal and breath sounds normal. No wheezing, rales or rhonchi. Abdominal: Soft, nondistended, nontender. Bowel sounds active throughout. There are no masses palpable. No hepatomegaly. Neurological: Alert and  oriented to person place and time. Skin: Skin is warm and dry. No rashes noted.   ASSESSMENT AND PLAN;   [name] is a 37 y.o. female presenting with long standing history of abdominal pain with associated n/v and diarrhea. Recent CT concerning for internal hernia, but diagnostic laparoscopy was essentially normal per op report, aside from a very mobile cecum/appendix on a long mesentery and a very redundant sigmoid colon with a long mesentery. Otherwise, no e/o malrotation.  Otherwise, normal serosal surface throughout.   At this time, the etiology for her recurrent abdominal pain not clear. We discussed additional ddx, to include mucosal etiologies and will plan on evaluation as below:  - EGD/colonoscopy to eval for mucosal/luminal pathology. Will plan on random and directed bxs - Trial course of Bentyl prn - RTC after studies  The indications, risks, and benefits of EGD and colonoscopy were explained to the patient in detail. Risks include but are not limited to bleeding, perforation, adverse reaction to medications, and cardiopulmonary compromise. Sequelae include but are not limited to the possibility of surgery, hositalization, and mortality. The patient verbalized understanding and wished to proceed. All questions answered, referred to scheduler and bowel prep ordered. Further recommendations pending results of the exam.    Shellia Cleverly, DO, FACG  04/01/2018, 1:29 PM   Marrian Salvage, PA-C

## 2018-04-13 ENCOUNTER — Encounter: Payer: Self-pay | Admitting: Gastroenterology

## 2018-04-27 ENCOUNTER — Ambulatory Visit (AMBULATORY_SURGERY_CENTER): Payer: 59 | Admitting: Gastroenterology

## 2018-04-27 ENCOUNTER — Encounter: Payer: Self-pay | Admitting: Gastroenterology

## 2018-04-27 VITALS — BP 128/77 | HR 70 | Temp 98.2°F | Resp 17 | Ht 68.0 in | Wt 176.0 lb

## 2018-04-27 DIAGNOSIS — K2970 Gastritis, unspecified, without bleeding: Secondary | ICD-10-CM | POA: Diagnosis not present

## 2018-04-27 DIAGNOSIS — R1084 Generalized abdominal pain: Secondary | ICD-10-CM | POA: Diagnosis present

## 2018-04-27 DIAGNOSIS — K2990 Gastroduodenitis, unspecified, without bleeding: Secondary | ICD-10-CM

## 2018-04-27 DIAGNOSIS — R109 Unspecified abdominal pain: Secondary | ICD-10-CM

## 2018-04-27 DIAGNOSIS — R197 Diarrhea, unspecified: Secondary | ICD-10-CM

## 2018-04-27 MED ORDER — SODIUM CHLORIDE 0.9 % IV SOLN: 500.0000 mL | Freq: Once | INTRAVENOUS | Status: DC

## 2018-04-27 NOTE — Op Note (Signed)
[facility] Patient Name: Victoria Romero Procedure Date: 04/27/2018 1:59 PM MRN: [id number] Endoscopist: Doristine Locks , MD Age: 37 Referring MD:  Date of Birth: Oct 27, 1980 Gender: Female Account #: [id number] Procedure:                Colonoscopy Indications:              Generalized abdominal pain, Diarrhea Medicines:                Monitored Anesthesia Care Procedure:                Pre-Anesthesia Assessment:                           - Prior to the procedure, a History and Physical                            was performed, and patient medications and                            allergies were reviewed. The patient's tolerance of                            previous anesthesia was also reviewed. The risks                            and benefits of the procedure and the sedation                            options and risks were discussed with the patient.                            All questions were answered, and informed consent                            was obtained. Prior Anticoagulants: The patient has                            taken no previous anticoagulant or antiplatelet                            agents. ASA Grade Assessment: II - A patient with                            mild systemic disease. After reviewing the risks                            and benefits, the patient was deemed in                            satisfactory condition to undergo the procedure.                           After obtaining informed consent, the colonoscope  was passed under direct vision. Throughout the                            procedure, the patient's blood pressure, pulse, and                            oxygen saturations were monitored continuously. The                            Colonoscope was introduced through the anus and                            advanced to the the terminal ileum. The colonoscopy                            was technically  difficult and complex due to a                            redundant colon and significant looping. Successful                            completion of the procedure was aided by applying                            abdominal pressure. The patient tolerated the                            procedure well. The quality of the bowel                            preparation was adequate. Scope In: 2:16:34 PM Scope Out: 2:33:50 PM Scope Withdrawal Time: 0 hours 11 minutes 40 seconds  Total Procedure Duration: 0 hours 17 minutes 16 seconds  Findings:                 The perianal and digital rectal examinations were                            normal.                           The sigmoid colon was significantly redundant and                            tortuous. External pressure was applied to the                            patient's abdomen in order to accomplish the                            maneuver.                           Normal mucosa was found in the entire colon.  Biopsies for histology were taken with a cold                            forceps from the right colon and left colon for                            evaluation of microscopic colitis. Estimated blood                            loss was minimal.                           The retroflexed view of the distal rectum and anal                            verge was normal and showed no anal or rectal                            abnormalities.                           The terminal ileum appeared normal. Complications:            No immediate complications. Estimated Blood Loss:     Estimated blood loss was minimal. Impression:               - Redundant colon.                           - Normal mucosa in the entire examined colon.                            Biopsied.                           - The distal rectum and anal verge are normal on                            retroflexion view.                           -  The examined portion of the ileum was normal. Recommendation:           - Patient has a contact number available for                            emergencies. The signs and symptoms of potential                            delayed complications were discussed with the                            patient. Return to normal activities tomorrow.                            Written discharge instructions were provided to the  patient.                           - Resume previous diet today.                           - Continue present medications.                           - Await pathology results.                           - Repeat colonoscopy at age 32 for screening                            purposes.                           - Return to GI clinic in 3 months or sooner as                            needed. Doristine Locks, MD 04/27/2018 2:48:18 PM

## 2018-04-27 NOTE — Progress Notes (Signed)
Called to room to assist during endoscopic procedure.  Patient ID and intended procedure confirmed with present staff. Received instructions for my participation in the procedure from the performing physician.  

## 2018-04-27 NOTE — Op Note (Signed)
[facility] Patient Name: Victoria Romero Procedure Date: 04/27/2018 1:59 PM MRN: [id number] Endoscopist: Doristine Locks , MD Age: 37 Referring MD:  Date of Birth: 1981/06/27 Gender: Female Account #: [id number] Procedure:                Upper GI endoscopy Indications:              Generalized abdominal pain, Diarrhea, Nausea with                            vomiting Medicines:                Monitored Anesthesia Care Procedure:                Pre-Anesthesia Assessment:                           - Prior to the procedure, a History and Physical                            was performed, and patient medications and                            allergies were reviewed. The patient's tolerance of                            previous anesthesia was also reviewed. The risks                            and benefits of the procedure and the sedation                            options and risks were discussed with the patient.                            All questions were answered, and informed consent                            was obtained. Prior Anticoagulants: The patient has                            taken no previous anticoagulant or antiplatelet                            agents. ASA Grade Assessment: II - A patient with                            mild systemic disease. After reviewing the risks                            and benefits, the patient was deemed in                            satisfactory condition to undergo the procedure.  After obtaining informed consent, the endoscope was                            passed under direct vision. Throughout the                            procedure, the patient's blood pressure, pulse, and                            oxygen saturations were monitored continuously. The                            Model GIF-HQ190 [id number]) scope was introduced                            through the mouth, and advanced to the third  part                            of duodenum. The upper GI endoscopy was                            accomplished without difficulty. The patient                            tolerated the procedure well. Scope In: Scope Out: Findings:                 The examined esophagus was normal.                           The gastroesophageal flap valve was visualized                            endoscopically and classified as Hill Grade II                            (fold present, opens with respiration).                           Scattered mild inflammation characterized by                            erythema was found in the gastric fundus and in the                            gastric body. Biopsies were taken with a cold                            forceps for Helicobacter pylori testing. Estimated                            blood loss was minimal.                           The gastric antrum and pylorus were normal.  The duodenal bulb, first portion of the duodenum,                            second portion of the duodenum and third portion of                            the duodenum were normal. Biopsies for histology                            were taken with a cold forceps for evaluation of                            celiac disease. Estimated blood loss was minimal. Complications:            No immediate complications. Estimated Blood Loss:     Estimated blood loss was minimal. Impression:               - Normal esophagus.                           - Gastroesophageal flap valve classified as Hill                            Grade II (fold present, opens with respiration).                           - Gastritis. Biopsied.                           - Normal antrum and pylorus.                           - Normal duodenal bulb, first portion of the                            duodenum, second portion of the duodenum and third                            portion of the duodenum.  Biopsied. Recommendation:           - Patient has a contact number available for                            emergencies. The signs and symptoms of potential                            delayed complications were discussed with the                            patient. Return to normal activities tomorrow.                            Written discharge instructions were provided to the  patient.                           - Resume previous diet today.                           - Continue present medications.                           - Await pathology results.                           - Return to GI clinic in 3 months, or sooner as                            needed. Doristine Locks, MD 04/27/2018 2:38:15 PM

## 2018-04-27 NOTE — Progress Notes (Signed)
Report given to PACU, vss 

## 2018-04-27 NOTE — Patient Instructions (Signed)
Thank you for allowing [place] to care for you today.  Await pathology results by mail, approx 2 weeks.  Repeat colonoscopy at age 37 for screening purposes.   Return to GI clinic in 3 months, or sooner as needed.  Resume previous diet and medications today.  Return to normal activities tomorrow.    YOU HAD AN ENDOSCOPIC PROCEDURE TODAY AT THE [facility]:   Refer to the procedure report that was given to you for any specific questions about what was found during the examination.  If the procedure report does not answer your questions, please call your gastroenterologist to clarify.  If you requested that your care partner not be given the details of your procedure findings, then the procedure report has been included in a sealed envelope for you to review at your convenience later.  YOU SHOULD EXPECT: Some feelings of bloating in the abdomen. Passage of more gas than usual.  Walking can help get rid of the air that was put into your GI tract during the procedure and reduce the bloating. If you had a lower endoscopy (such as a colonoscopy or flexible sigmoidoscopy) you may notice spotting of blood in your stool or on the toilet paper. If you underwent a bowel prep for your procedure, you may not have a normal bowel movement for a few days.  Please Note:  You might notice some irritation and congestion in your nose or some drainage.  This is from the oxygen used during your procedure.  There is no need for concern and it should clear up in a day or so.  SYMPTOMS TO REPORT IMMEDIATELY:   Following lower endoscopy (colonoscopy or flexible sigmoidoscopy):  Excessive amounts of blood in the stool  Significant tenderness or worsening of abdominal pains  Swelling of the abdomen that is new, acute  Fever of 100F or higher   Following upper endoscopy (EGD)  Vomiting of blood or coffee ground material  New chest pain or pain under the shoulder blades  Painful or persistently  difficult swallowing  New shortness of breath  Fever of 100F or higher  Black, tarry-looking stools  For urgent or emergent issues, a gastroenterologist can be reached at any hour by calling [phone].   DIET:  We do recommend a small meal at first, but then you may proceed to your regular diet.  Drink plenty of fluids but you should avoid alcoholic beverages for 24 hours.  ACTIVITY:  You should plan to take it easy for the rest of today and you should NOT DRIVE or use heavy machinery until tomorrow (because of the sedation medicines used during the test).    FOLLOW UP: Our staff will call the number listed on your records the next business day following your procedure to check on you and address any questions or concerns that you may have regarding the information given to you following your procedure. If we do not reach you, we will leave a message.  However, if you are feeling well and you are not experiencing any problems, there is no need to return our call.  We will assume that you have returned to your regular daily activities without incident.  If any biopsies were taken you will be contacted by phone or by letter within the next 1-3 weeks.  Please call us at [phone] if you have not heard about the biopsies in 3 weeks.    SIGNATURES/CONFIDENTIALITY: You and/or your care partner have signed paperwork which will be  entered into your electronic medical record.  These signatures attest to the fact that that the information above on your After Visit Summary has been reviewed and is understood.  Full responsibility of the confidentiality of this discharge information lies with you and/or your care-partner. 

## 2018-04-28 ENCOUNTER — Telehealth: Payer: Self-pay

## 2018-04-28 NOTE — Telephone Encounter (Signed)
  Follow up Call-  Call back number 04/27/2018  Post procedure Call Back phone  # [phone]  Permission to leave phone message Yes  Some recent data might be hidden     Patient questions:  Do you have a fever, pain , or abdominal swelling? No. Pain Score  0 *  Have you tolerated food without any problems? Yes.    Have you been able to return to your normal activities? Yes.    Do you have any questions about your discharge instructions: Diet   No. Medications  No. Follow up visit  No.  Do you have questions or concerns about your Care? No.  Actions: * If pain score is 4 or above: No action needed, pain <4.

## 2018-04-30 ENCOUNTER — Encounter: Payer: Self-pay | Admitting: Gastroenterology

## 2019-02-03 IMAGING — US US PELVIS COMPLETE
1 series · 15 of 25 positions shown · non-contrast
Comparison: None.

CLINICAL DATA: Heavy vaginal bleeding 13 days postpartum, evaluate
for retained products of conception

EXAM:
TRANSABDOMINAL ULTRASOUND OF PELVIS
TECHNIQUE: Transabdominal ultrasound examination of the pelvis was performed
including evaluation of the uterus, ovaries, adnexal regions, and
pelvic cul-de-sac.

[Series 1: us pelvis complete · 40 acquisitions, 15 frames shown]
[im 1/40]
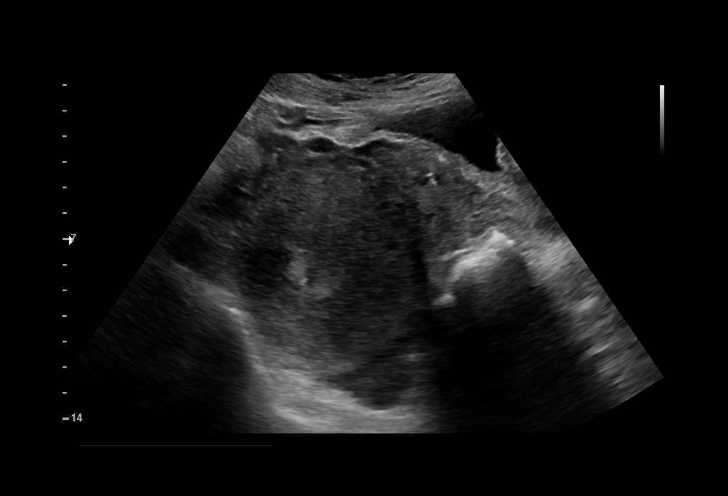
[im 4/40]
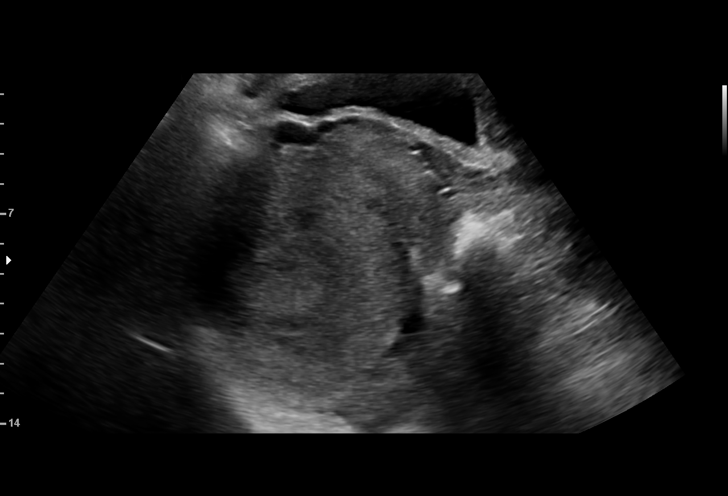
[im 7/40]
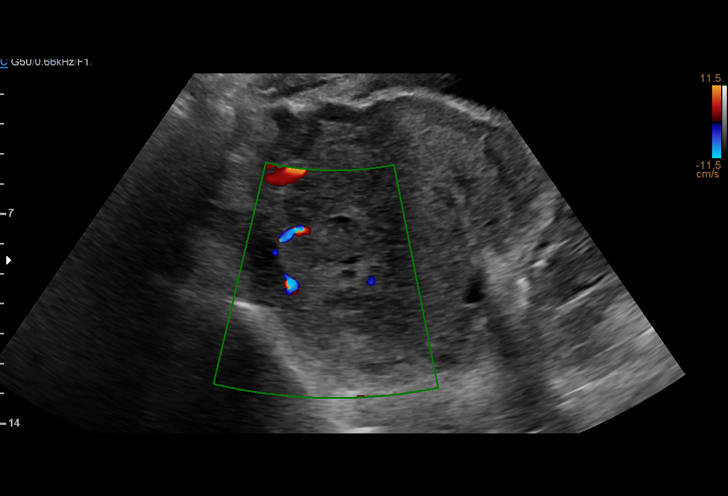
[im 9/40]
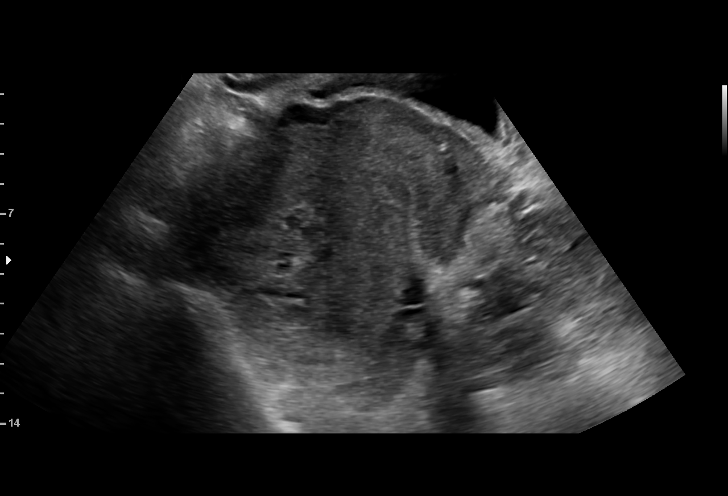
[im 12/40]
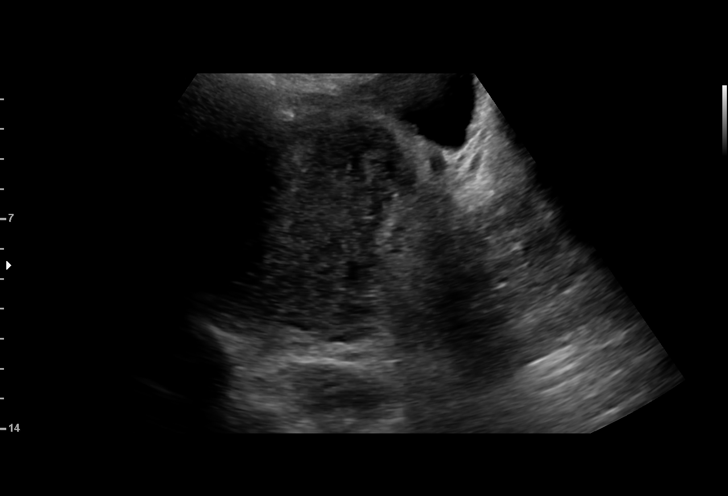
[im 15/40]
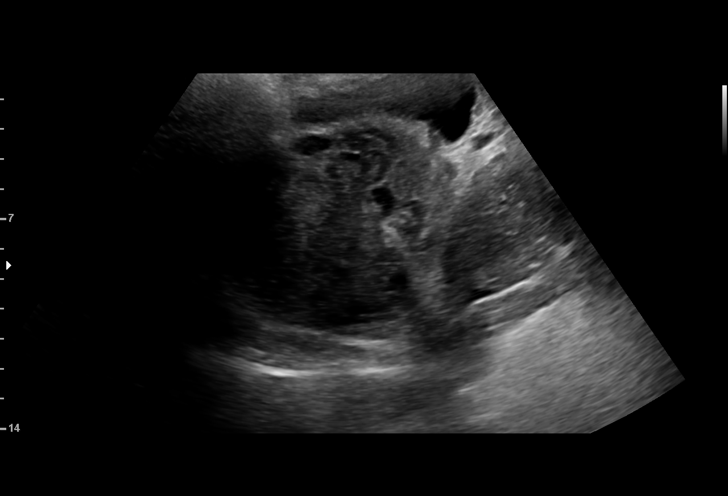
[im 17/40]
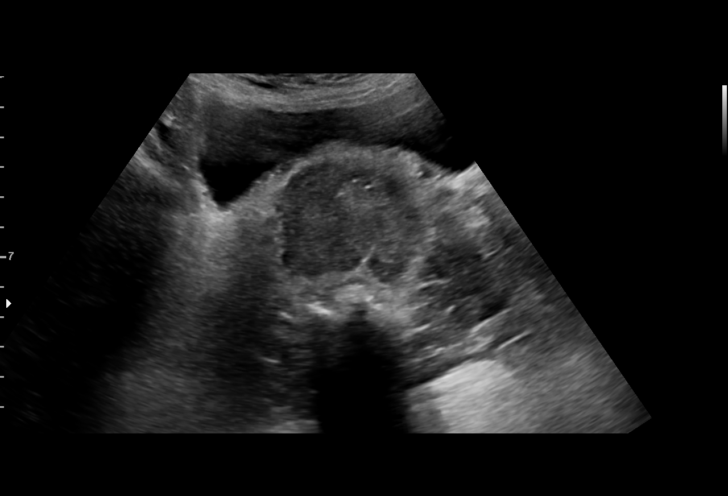
[im 20/40]
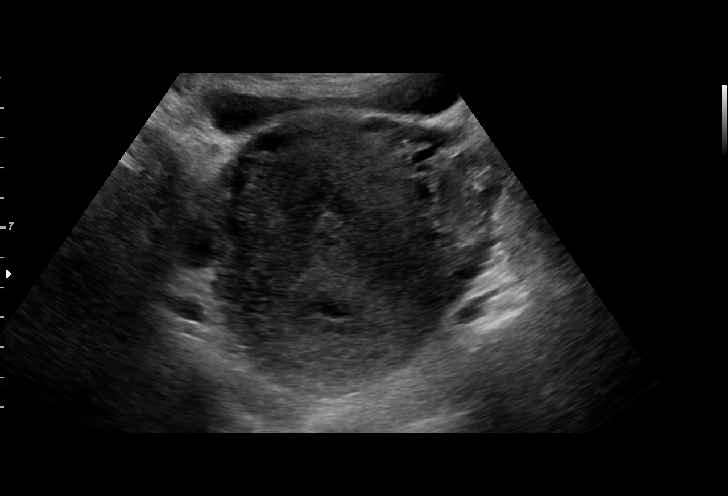
[im 23/40]
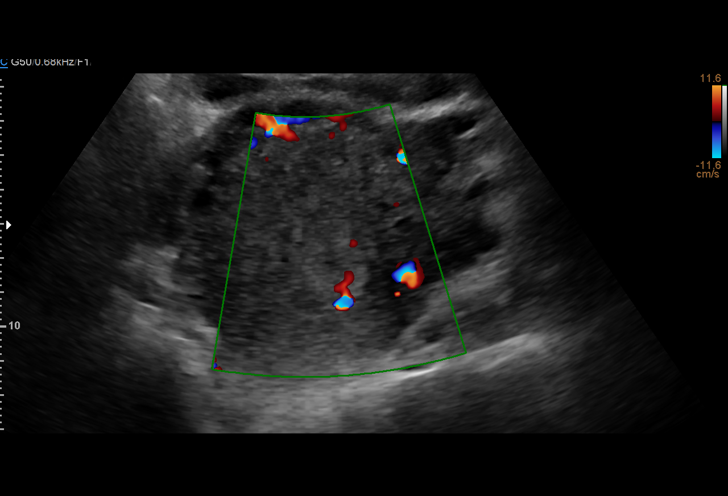
[im 25/40]
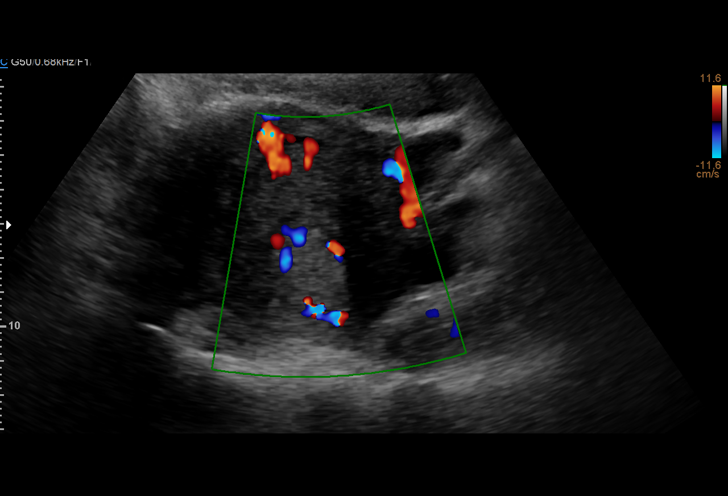
[im 28/40]
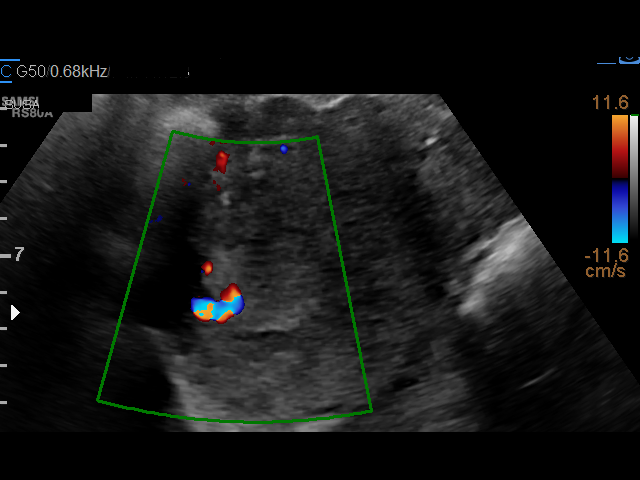
[im 31/40]
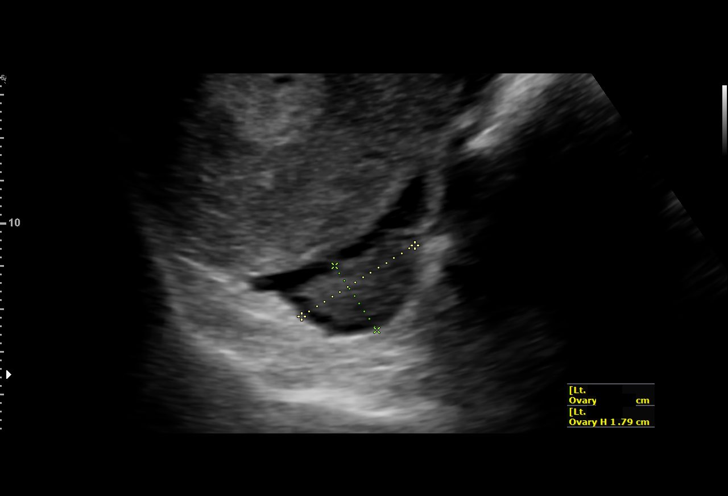
[im 33/40]
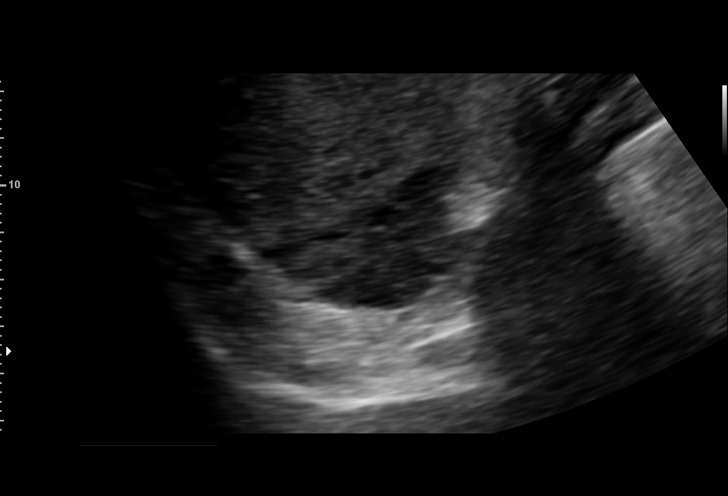
[im 36/40]
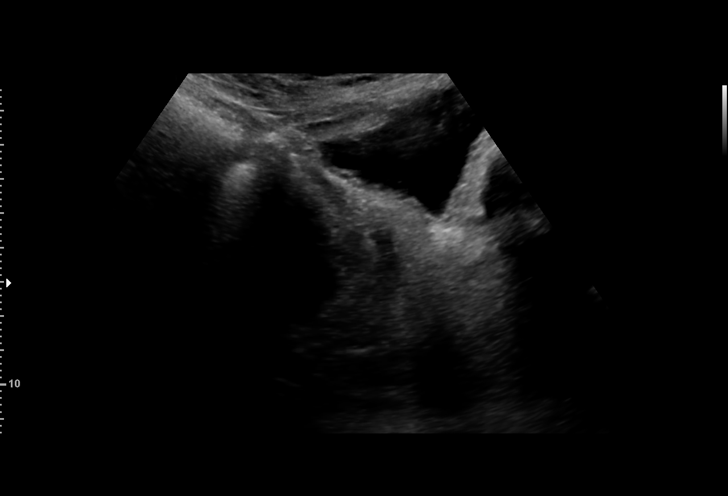
[im 40/40]
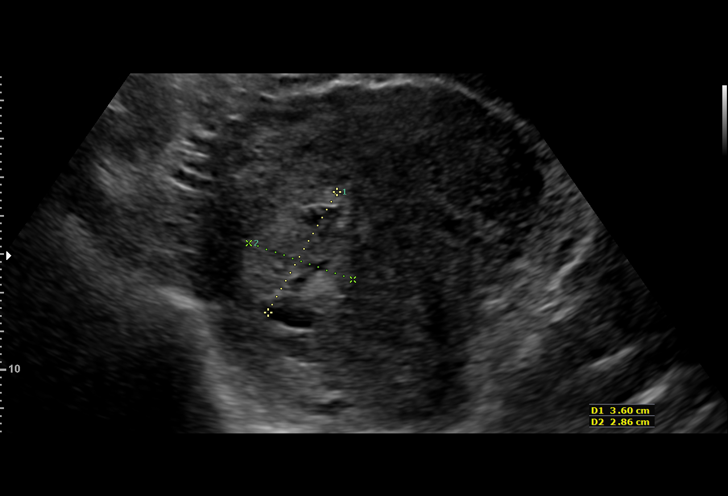

[15 of 25 positions shown; findings below may reference images not displayed]

FINDINGS: Uterus

Measurements: 10.1 x 7.1 x 8.2 cm. No fibroids or other mass
visualized.

Endometrium

Thickness: 2.8 cm. Focal thickening of the endometrium in the
uterine fundus, measuring 3.1 x 2.4 x 2.3 cm, with associated color
Doppler flow.

Right ovary

Not discretely visualized.  No adnexal mass is seen.

Left ovary

Measurements: 3.1 x 1.8 x 2.9 cm. Normal appearance/no adnexal mass.

Other findings:  No abnormal free fluid.
IMPRESSION: Focal thickening of the endometrium in the uterine fundus, measuring
3.1 x 2.4 x 2.3 cm, with associated color Doppler flow. This
appearance is worrisome for retained products of conception in this
clinical setting.

## 2019-04-04 IMAGING — US US RENAL
1 series · 14 of 25 positions shown · non-contrast
Comparison: 08/07/2016

CLINICAL DATA: Right pelvic pain

EXAM:
RENAL / URINARY TRACT ULTRASOUND COMPLETE

[Series 1: us renal · 14 of 39 slices shown]
[im 1/39]
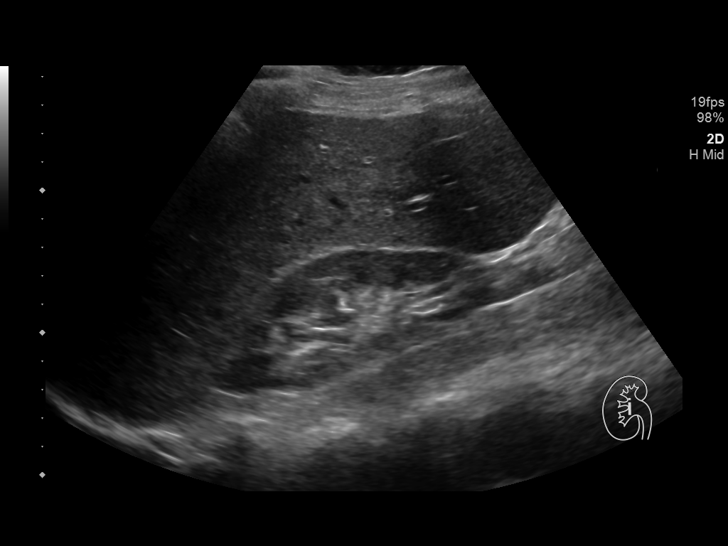
[im 4/39]
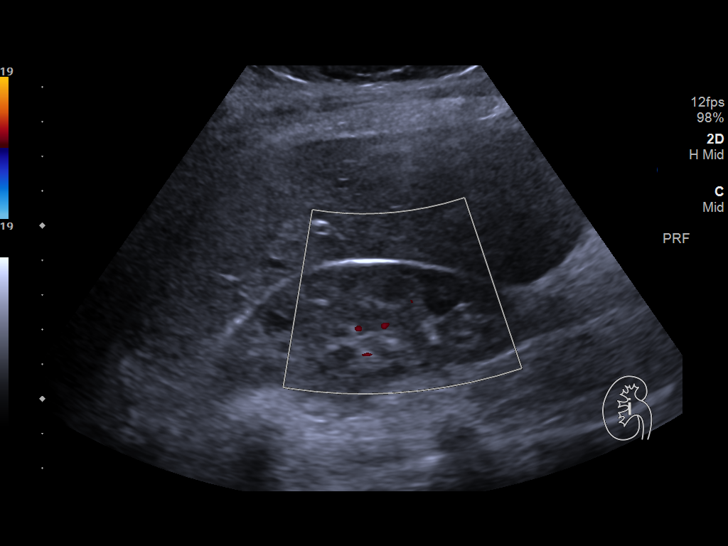
[im 7/39]
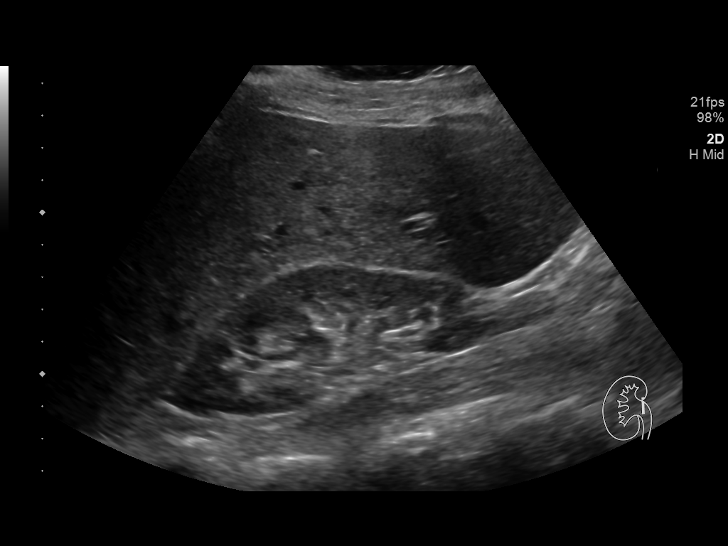
[im 10/39]
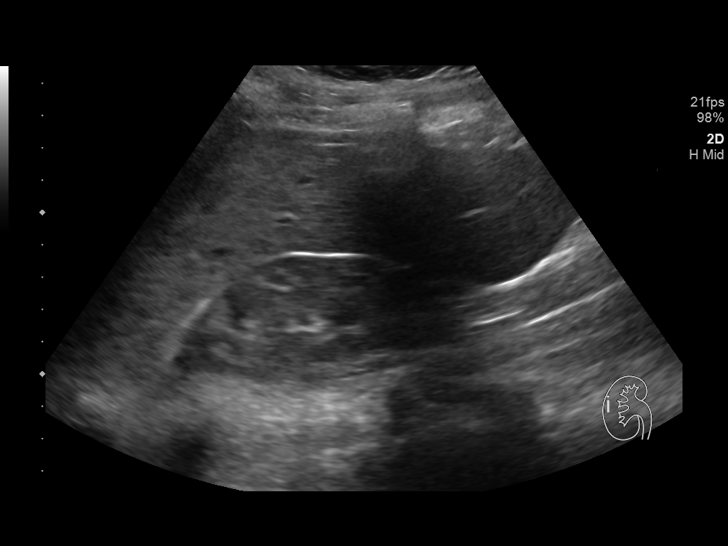
[im 13/39]
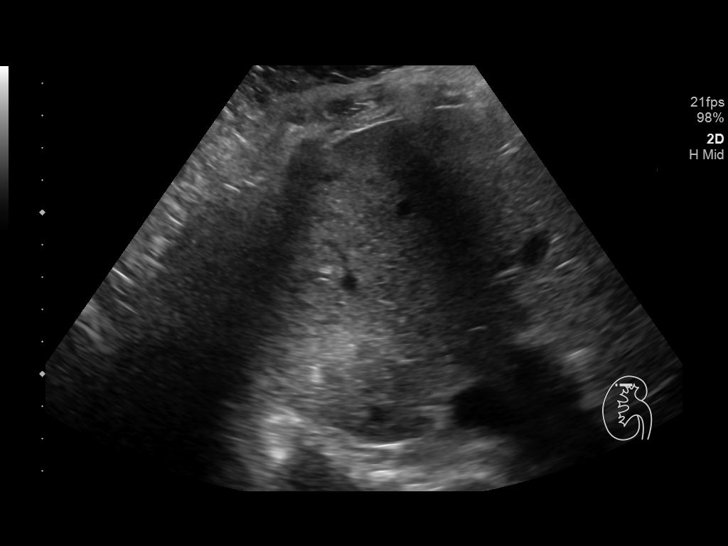
[im 15/39]
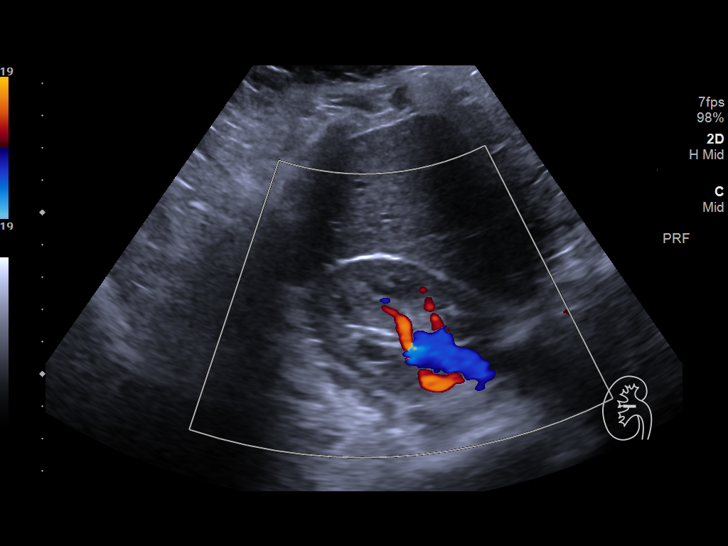
[im 18/39]
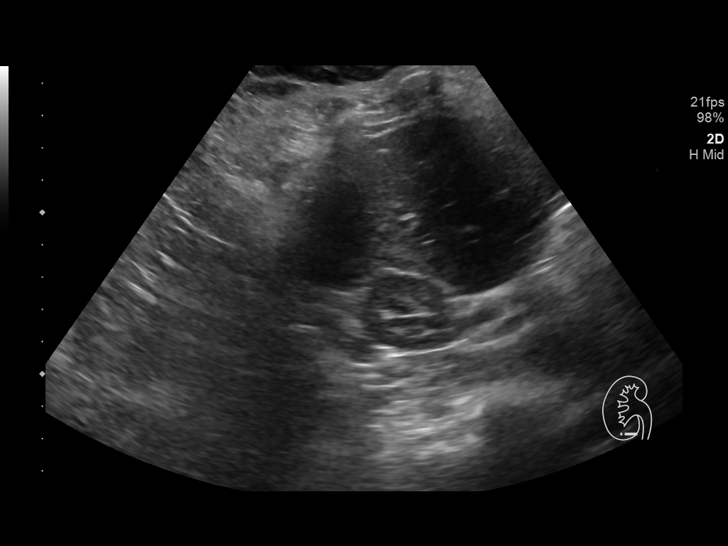
[im 21/39]
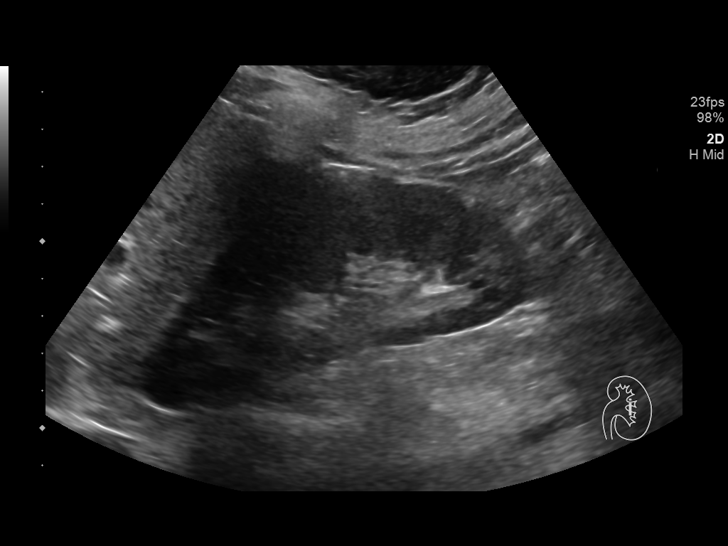
[im 24/39]
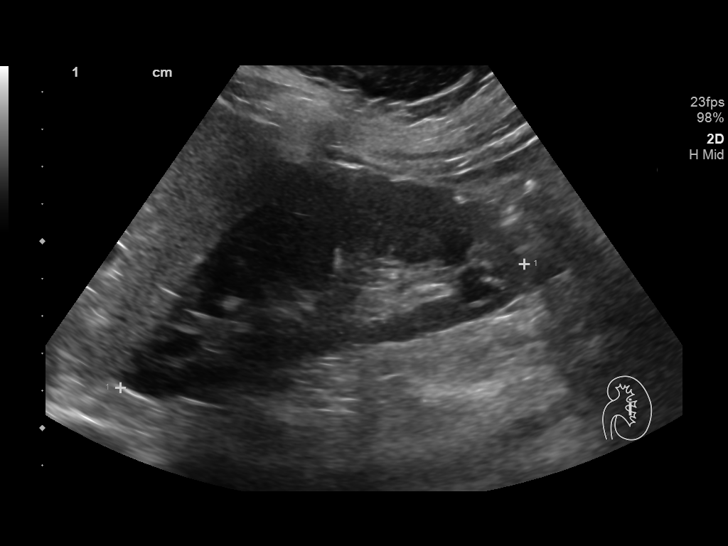
[im 26/39]
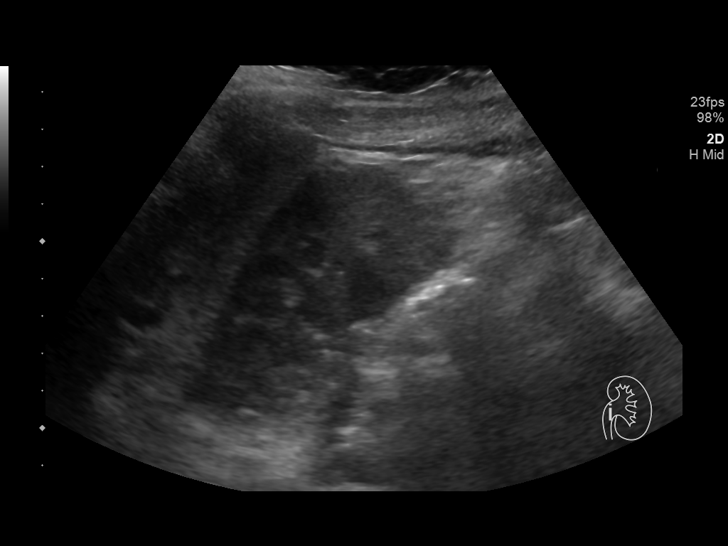
[im 29/39]
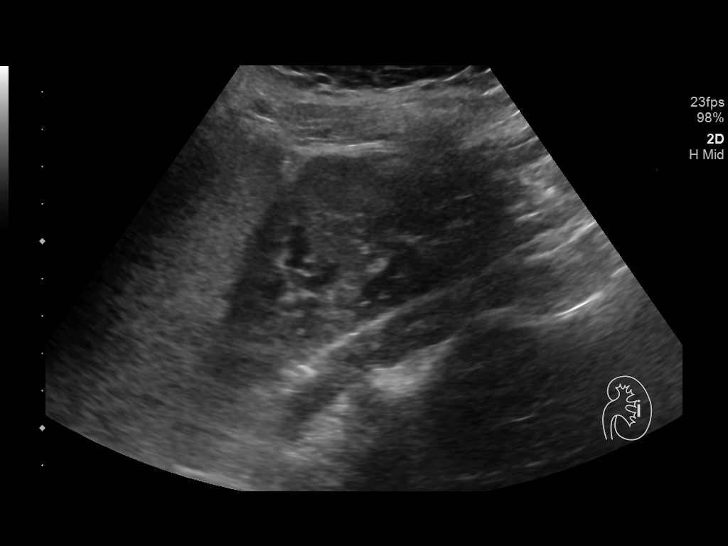
[im 32/39]
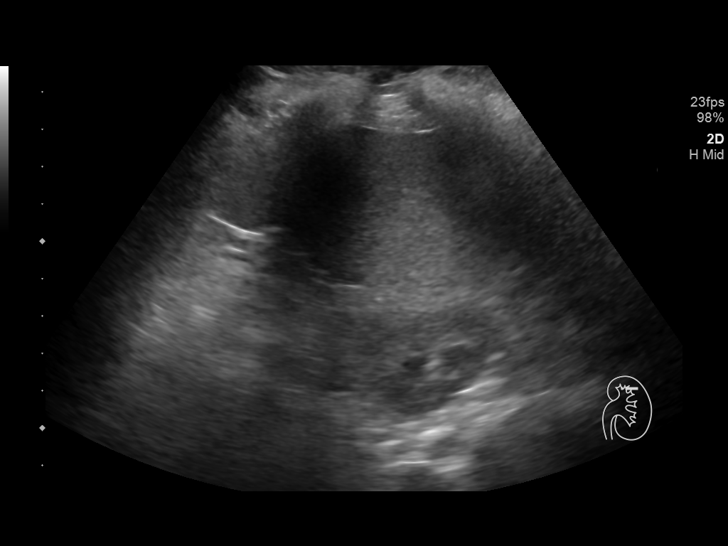
[im 35/39]
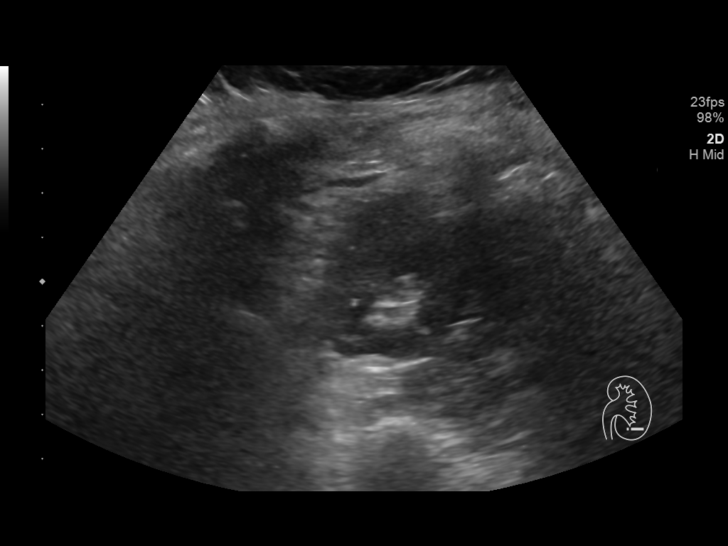
[im 39/39]
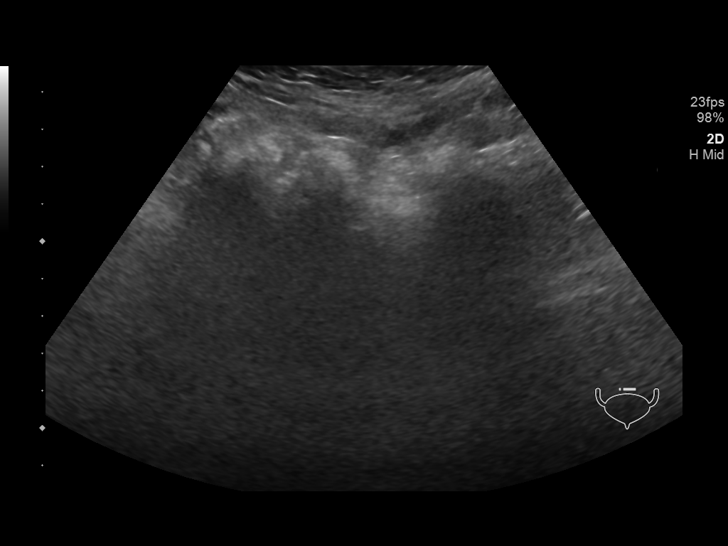

[14 of 25 positions shown; findings below may reference images not displayed]

FINDINGS: Right Kidney:

Length: 11.0.  No mass or hydronephrosis.

Left Kidney:

Length: 11.3.  No mass or hydronephrosis.

Bladder:

Not discretely visualized/underdistended.
IMPRESSION: Negative renal ultrasound.  No hydronephrosis.

## 2019-04-04 IMAGING — US US ART/VEN ABD/PELV/SCROTUM DOPPLER LTD
1 series · 13 of 25 positions shown · non-contrast
Comparison: None.

Pelvic ultrasound dated 01/20/2018

CLINICAL DATA: Right pelvic pain

EXAM:
TRANSABDOMINAL AND TRANSVAGINAL ULTRASOUND OF PELVIS
DOPPLER ULTRASOUND OF OVARIES
TECHNIQUE: Both transabdominal and transvaginal ultrasound examinations of the
pelvis were performed. Transabdominal technique was performed for
global imaging of the pelvis including uterus, ovaries, adnexal
regions, and pelvic cul-de-sac.
It was necessary to proceed with endovaginal exam following the
transabdominal exam to visualize the endometrium and bilateral
ovaries. Color and duplex Doppler ultrasound was utilized to
evaluate blood flow to the ovaries.

[Series 1: us art/ven abd/pelv/scrotum doppler ltd · 13 of 93 slices shown]
[im 1/93]
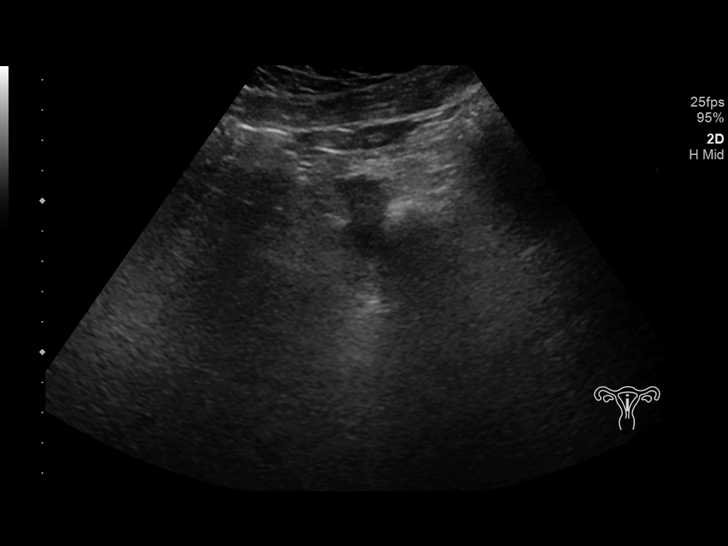
[im 8/93]
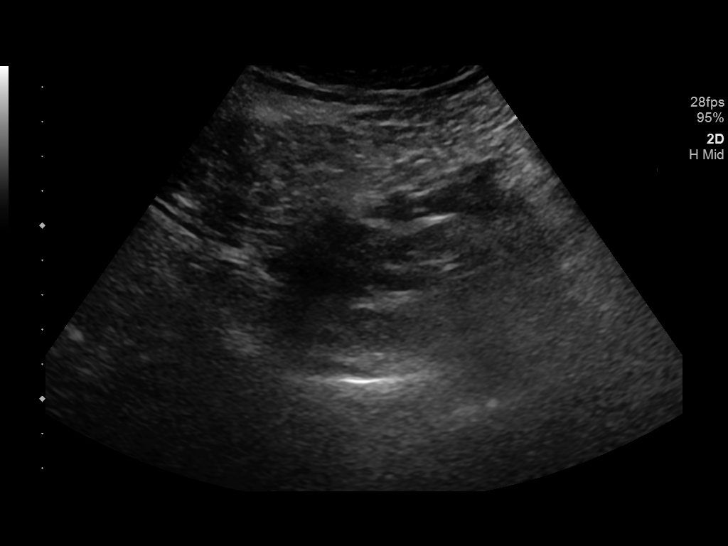
[im 16/93]
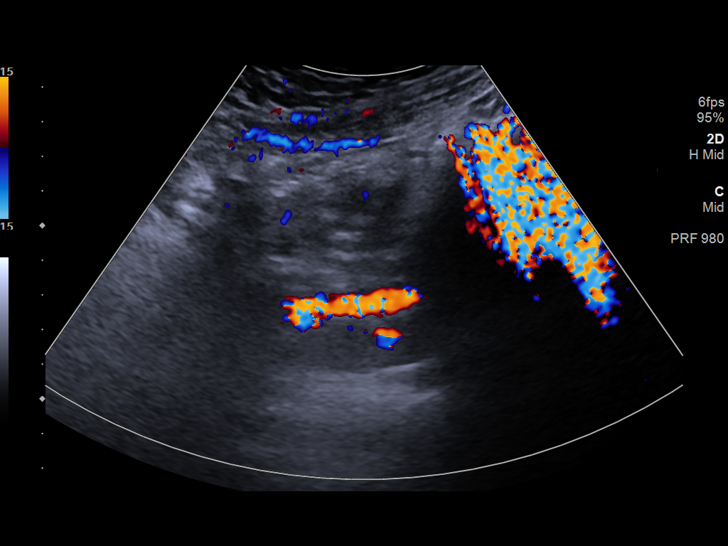
[im 24/93]
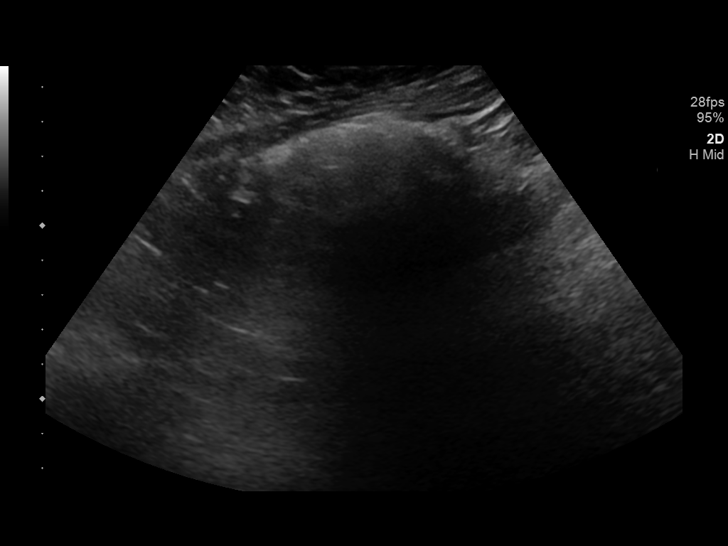
[im 31/93]
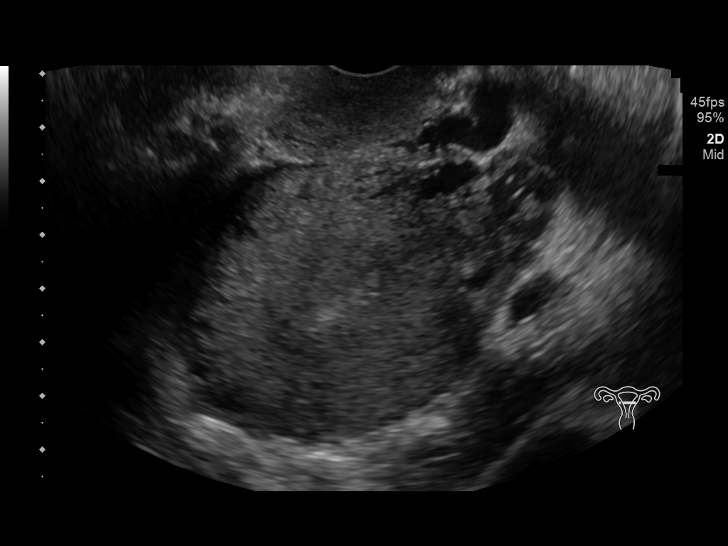
[im 39/93]
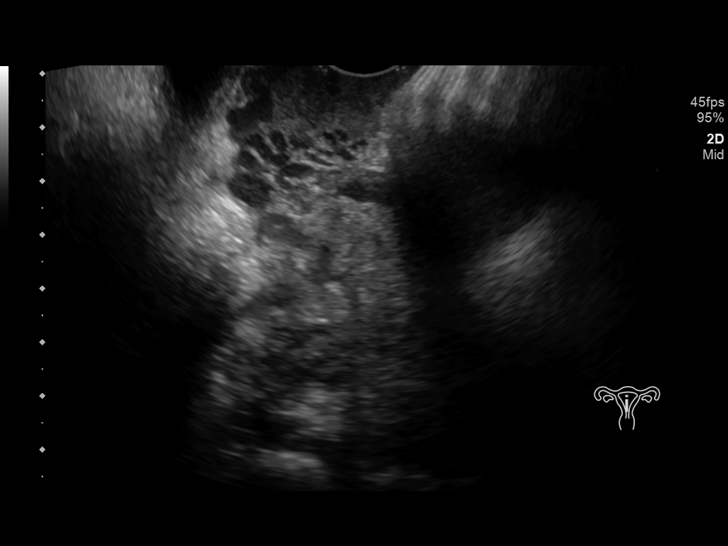
[im 47/93]
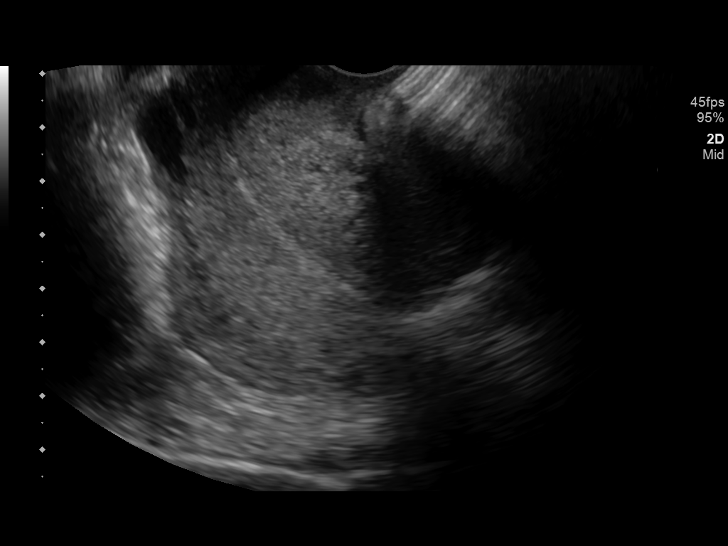
[im 54/93]
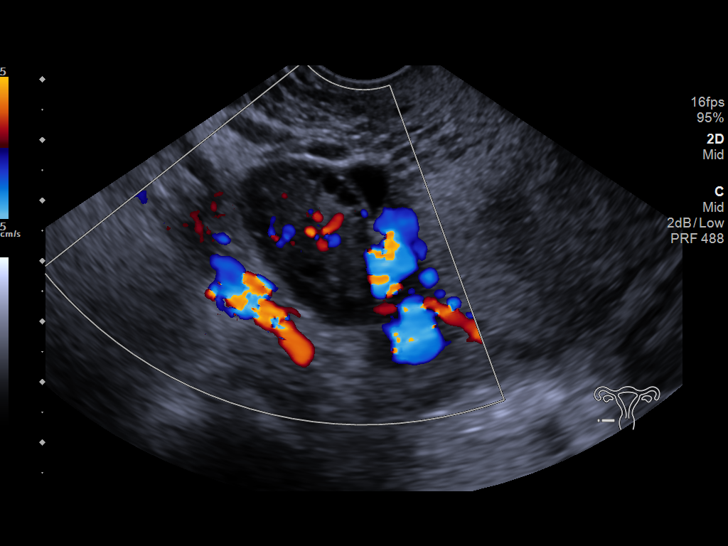
[im 62/93]
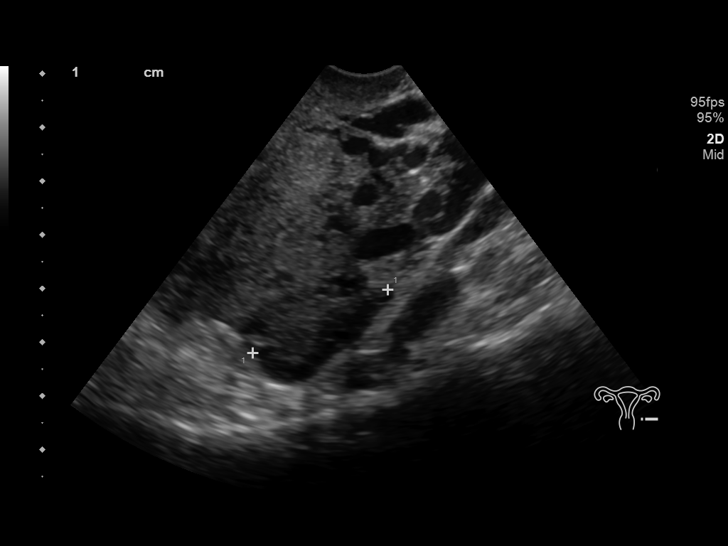
[im 70/93]
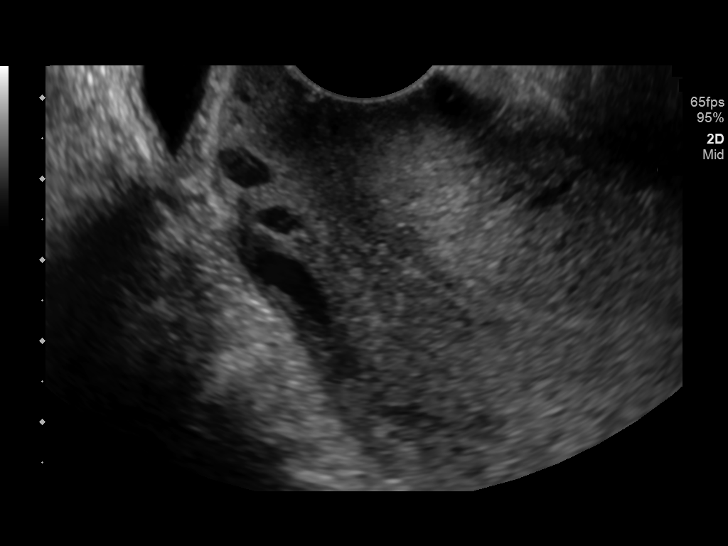
[im 77/93]
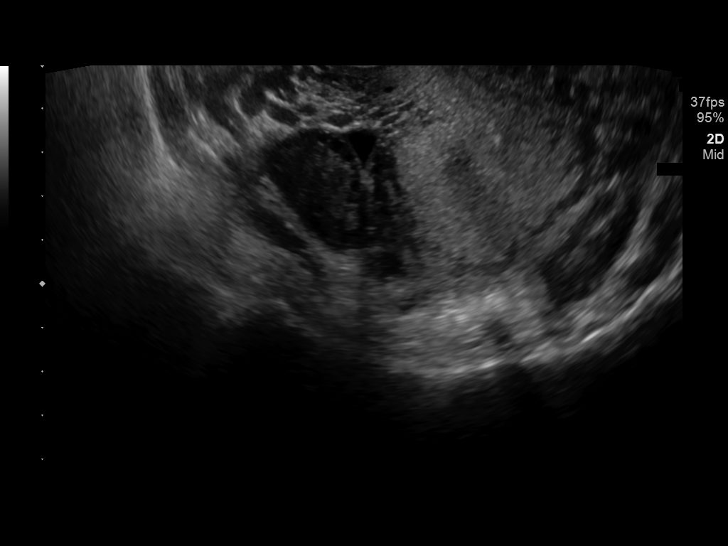
[im 85/93]
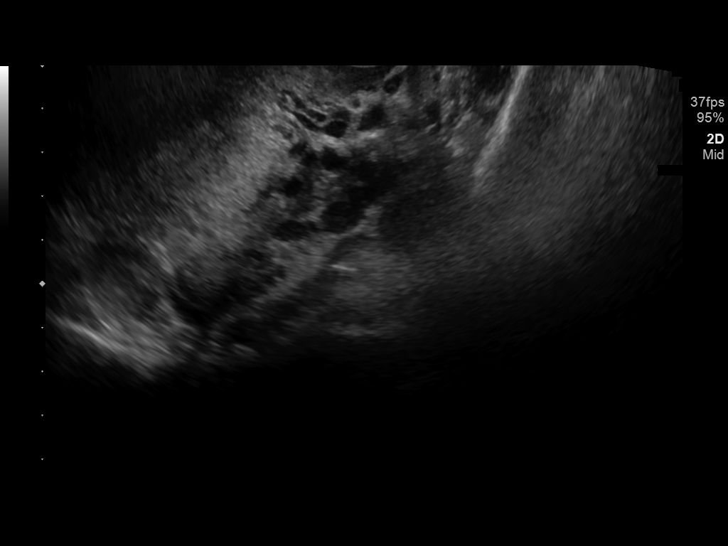
[im 93/93]
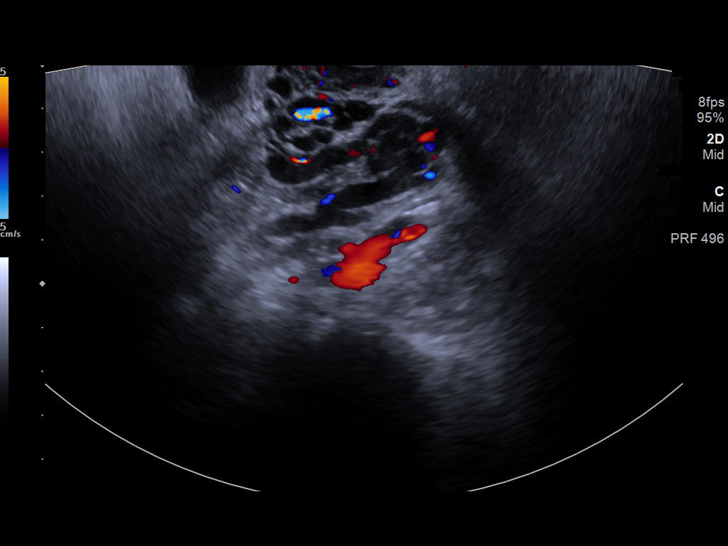

[13 of 25 positions shown; findings below may reference images not displayed]

FINDINGS: Uterus

Measurements: 7.1 x 4.6 x 6.0 cm. No fibroids or other mass
visualized.

Endometrium

Thickness: 4 mm.  No focal abnormality visualized.

Right ovary

Measurements: 3.9 x 2.1 x 1.9 cm. Normal appearance/no adnexal mass.

Left ovary

Measurements: 2.9 x 1.9 x 2.8 cm. Normal appearance/no adnexal mass.

Pulsed Doppler evaluation of both ovaries demonstrates normal
low-resistance arterial and venous waveforms.

Other findings

No abnormal free fluid.
IMPRESSION: Negative pelvic ultrasound.

No evidence of ovarian torsion.
